# Patient Record
Sex: Male | Born: 1955
Health system: Southern US, Community
[De-identification: ages and names within clinical notes are randomized; demographics above are authoritative.]

## PROBLEM LIST (undated history)

## (undated) DIAGNOSIS — T7840XA Allergy, unspecified, initial encounter: Secondary | ICD-10-CM

## (undated) DIAGNOSIS — Z973 Presence of spectacles and contact lenses: Secondary | ICD-10-CM

## (undated) DIAGNOSIS — E785 Hyperlipidemia, unspecified: Secondary | ICD-10-CM

## (undated) DIAGNOSIS — B182 Chronic viral hepatitis C: Secondary | ICD-10-CM

## (undated) DIAGNOSIS — K219 Gastro-esophageal reflux disease without esophagitis: Secondary | ICD-10-CM

## (undated) DIAGNOSIS — M1990 Unspecified osteoarthritis, unspecified site: Secondary | ICD-10-CM

## (undated) DIAGNOSIS — G2581 Restless legs syndrome: Secondary | ICD-10-CM

## (undated) DIAGNOSIS — T148XXA Other injury of unspecified body region, initial encounter: Secondary | ICD-10-CM

## (undated) DIAGNOSIS — N529 Male erectile dysfunction, unspecified: Secondary | ICD-10-CM

## (undated) DIAGNOSIS — I2510 Atherosclerotic heart disease of native coronary artery without angina pectoris: Secondary | ICD-10-CM

## (undated) HISTORY — DX: Chronic viral hepatitis C: B18.2

## (undated) HISTORY — DX: Hyperlipidemia, unspecified: E78.5

## (undated) HISTORY — PX: GUM SURGERY: SHX658

## (undated) HISTORY — PX: FRACTURE SURGERY: SHX138

## (undated) HISTORY — DX: Unspecified osteoarthritis, unspecified site: M19.90

## (undated) HISTORY — DX: Allergy, unspecified, initial encounter: T78.40XA

## (undated) HISTORY — PX: WISDOM TOOTH EXTRACTION: SHX21

## (undated) HISTORY — DX: Atherosclerotic heart disease of native coronary artery without angina pectoris: I25.10

## (undated) HISTORY — PX: COLONOSCOPY: SHX174

## (undated) HISTORY — DX: Gastro-esophageal reflux disease without esophagitis: K21.9

## (undated) HISTORY — DX: Male erectile dysfunction, unspecified: N52.9

---

## 1979-03-04 HISTORY — DX: Other motorcycle driver injured in collision with unspecified motor vehicles in traffic accident, initial encounter: V29.408A

## 2002-03-03 HISTORY — PX: APPENDECTOMY: SHX54

## 2004-05-20 ENCOUNTER — Ambulatory Visit: Payer: Self-pay | Admitting: Internal Medicine

## 2004-05-28 ENCOUNTER — Ambulatory Visit: Payer: Self-pay | Admitting: Internal Medicine

## 2004-06-17 ENCOUNTER — Ambulatory Visit: Payer: Self-pay | Admitting: Internal Medicine

## 2004-07-01 ENCOUNTER — Ambulatory Visit: Payer: Self-pay | Admitting: Internal Medicine

## 2004-07-02 ENCOUNTER — Encounter: Payer: Self-pay | Admitting: Internal Medicine

## 2004-09-12 ENCOUNTER — Ambulatory Visit: Payer: Self-pay | Admitting: Internal Medicine

## 2004-09-12 ENCOUNTER — Encounter: Payer: Self-pay | Admitting: Internal Medicine

## 2004-10-21 ENCOUNTER — Ambulatory Visit (HOSPITAL_COMMUNITY): Admission: RE | Admit: 2004-10-21 | Discharge: 2004-10-21 | Payer: Self-pay | Admitting: Internal Medicine

## 2004-10-21 ENCOUNTER — Encounter (INDEPENDENT_AMBULATORY_CARE_PROVIDER_SITE_OTHER): Payer: Self-pay | Admitting: Specialist

## 2005-04-14 ENCOUNTER — Ambulatory Visit: Payer: Self-pay | Admitting: Internal Medicine

## 2005-06-13 ENCOUNTER — Ambulatory Visit: Payer: Self-pay | Admitting: Internal Medicine

## 2005-09-25 ENCOUNTER — Ambulatory Visit: Payer: Self-pay | Admitting: Gastroenterology

## 2005-10-06 ENCOUNTER — Ambulatory Visit: Payer: Self-pay | Admitting: Gastroenterology

## 2006-01-20 ENCOUNTER — Ambulatory Visit: Payer: Self-pay | Admitting: Internal Medicine

## 2007-01-22 ENCOUNTER — Ambulatory Visit: Payer: Self-pay | Admitting: Family Medicine

## 2007-01-22 DIAGNOSIS — B351 Tinea unguium: Secondary | ICD-10-CM | POA: Insufficient documentation

## 2007-01-22 DIAGNOSIS — Z8619 Personal history of other infectious and parasitic diseases: Secondary | ICD-10-CM

## 2007-09-08 ENCOUNTER — Ambulatory Visit: Payer: Self-pay | Admitting: Internal Medicine

## 2007-09-08 DIAGNOSIS — R5381 Other malaise: Secondary | ICD-10-CM | POA: Insufficient documentation

## 2007-09-08 DIAGNOSIS — R5383 Other fatigue: Secondary | ICD-10-CM

## 2007-09-08 DIAGNOSIS — K219 Gastro-esophageal reflux disease without esophagitis: Secondary | ICD-10-CM | POA: Insufficient documentation

## 2007-09-08 DIAGNOSIS — E785 Hyperlipidemia, unspecified: Secondary | ICD-10-CM | POA: Insufficient documentation

## 2007-09-09 ENCOUNTER — Telehealth: Payer: Self-pay | Admitting: Internal Medicine

## 2007-09-09 LAB — CONVERTED CEMR LAB
ALT: 86 units/L — ABNORMAL HIGH (ref 0–53)
Albumin: 4 g/dL (ref 3.5–5.2)
Basophils Absolute: 0 10*3/uL (ref 0.0–0.1)
Basophils Relative: 0.5 % (ref 0.0–1.0)
Bilirubin, Direct: 0.1 mg/dL (ref 0.0–0.3)
CO2: 31 meq/L (ref 19–32)
Chloride: 107 meq/L (ref 96–112)
Eosinophils Relative: 1.9 % (ref 0.0–5.0)
Glucose, Bld: 72 mg/dL (ref 70–99)
HCT: 39.8 % (ref 39.0–52.0)
Hemoglobin: 13.9 g/dL (ref 13.0–17.0)
MCV: 90.8 fL (ref 78.0–100.0)
Neutro Abs: 1.5 10*3/uL (ref 1.4–7.7)
Platelets: 150 10*3/uL (ref 150–400)
Potassium: 5.3 meq/L — ABNORMAL HIGH (ref 3.5–5.1)
RBC: 4.38 M/uL (ref 4.22–5.81)
RDW: 12.8 % (ref 11.5–14.6)
Sodium: 142 meq/L (ref 135–145)
TSH: 2.18 microintl units/mL (ref 0.35–5.50)
Total Bilirubin: 0.9 mg/dL (ref 0.3–1.2)
Total Protein: 7.2 g/dL (ref 6.0–8.3)
WBC: 3.2 10*3/uL — ABNORMAL LOW (ref 4.5–10.5)

## 2007-10-19 ENCOUNTER — Telehealth: Payer: Self-pay | Admitting: Internal Medicine

## 2008-10-17 ENCOUNTER — Telehealth: Payer: Self-pay | Admitting: Internal Medicine

## 2008-12-04 ENCOUNTER — Ambulatory Visit: Payer: Self-pay | Admitting: Internal Medicine

## 2008-12-04 DIAGNOSIS — N529 Male erectile dysfunction, unspecified: Secondary | ICD-10-CM

## 2008-12-07 LAB — CONVERTED CEMR LAB
Alkaline Phosphatase: 56 units/L (ref 39–117)
Basophils Relative: 1.7 % (ref 0.0–3.0)
Bilirubin, Direct: 0.1 mg/dL (ref 0.0–0.3)
Calcium: 9.8 mg/dL (ref 8.4–10.5)
Eosinophils Absolute: 0.1 10*3/uL (ref 0.0–0.7)
Glucose, Bld: 90 mg/dL (ref 70–99)
Hemoglobin: 14.3 g/dL (ref 13.0–17.0)
Lymphs Abs: 1.7 10*3/uL (ref 0.7–4.0)
MCV: 95.6 fL (ref 78.0–100.0)
Monocytes Absolute: 0.6 10*3/uL (ref 0.1–1.0)
Neutro Abs: 1.7 10*3/uL (ref 1.4–7.7)
Neutrophils Relative %: 43.1 % (ref 43.0–77.0)
RBC: 4.45 M/uL (ref 4.22–5.81)
RDW: 12.1 % (ref 11.5–14.6)
Sodium: 141 meq/L (ref 135–145)
Total Bilirubin: 0.6 mg/dL (ref 0.3–1.2)
Total Protein: 7.3 g/dL (ref 6.0–8.3)

## 2009-01-08 ENCOUNTER — Ambulatory Visit: Payer: Self-pay | Admitting: Internal Medicine

## 2009-01-08 LAB — CONVERTED CEMR LAB
ALT: 152 units/L — ABNORMAL HIGH (ref 0–53)
Alkaline Phosphatase: 59 units/L (ref 39–117)
Bilirubin, Direct: 0 mg/dL (ref 0.0–0.3)
INR: 1 (ref 0.8–1.0)
Prothrombin Time: 10.2 s (ref 9.1–11.7)
Total Bilirubin: 0.7 mg/dL (ref 0.3–1.2)
Total Protein: 7.7 g/dL (ref 6.0–8.3)

## 2009-05-01 HISTORY — PX: OTHER SURGICAL HISTORY: SHX169

## 2009-06-14 ENCOUNTER — Ambulatory Visit: Payer: Self-pay | Admitting: Internal Medicine

## 2009-06-14 DIAGNOSIS — L570 Actinic keratosis: Secondary | ICD-10-CM | POA: Insufficient documentation

## 2009-09-27 ENCOUNTER — Telehealth: Payer: Self-pay | Admitting: Internal Medicine

## 2010-04-02 NOTE — Progress Notes (Signed)
Summary: refill request for viagra  Phone Note Refill Request Call back at Work Phone [phone] Message from:  Patient  Refills Requested: Medication #1:  VIAGRA 100 MG  TABS 1/2 - 1 about a half hour before sex. Phoned request from pt, [facility].  Initial call taken by: Lowella Petties CMA,  September 27, 2009 12:26 PM  Follow-up for Phone Call        okay #10 x 3 Follow-up by: Cindee Salt MD,  September 27, 2009 1:20 PM  Additional Follow-up for Phone Call Additional follow up Details #1::        Rx called to pharmacy Additional Follow-up by: Janee Morn CMA,  September 27, 2009 1:50 PM    Prescriptions: VIAGRA 100 MG  TABS (SILDENAFIL CITRATE) 1/2 - 1 about a half hour before sex  #10 x 3   Entered by:   Janee Morn CMA   Authorized by:   Cindee Salt MD   Signed by:   Janee Morn CMA on 09/27/2009   Method used:   Telephoned to ...         RxID:   [id number]
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
## 2010-04-17 ENCOUNTER — Other Ambulatory Visit: Payer: Self-pay | Admitting: Internal Medicine

## 2010-04-17 ENCOUNTER — Encounter: Payer: Self-pay | Admitting: Internal Medicine

## 2010-04-17 ENCOUNTER — Encounter (INDEPENDENT_AMBULATORY_CARE_PROVIDER_SITE_OTHER): Payer: BC Managed Care – PPO | Admitting: Internal Medicine

## 2010-04-17 DIAGNOSIS — Z125 Encounter for screening for malignant neoplasm of prostate: Secondary | ICD-10-CM

## 2010-04-17 DIAGNOSIS — B182 Chronic viral hepatitis C: Secondary | ICD-10-CM

## 2010-04-17 DIAGNOSIS — K219 Gastro-esophageal reflux disease without esophagitis: Secondary | ICD-10-CM

## 2010-04-17 DIAGNOSIS — Z0000 Encounter for general adult medical examination without abnormal findings: Secondary | ICD-10-CM

## 2010-04-17 LAB — PROTIME-INR: INR: 1 ratio (ref 0.8–1.0)

## 2010-04-17 LAB — HEPATIC FUNCTION PANEL
AST: 71 U/L — ABNORMAL HIGH (ref 0–37)
Alkaline Phosphatase: 54 U/L (ref 39–117)
Total Bilirubin: 0.5 mg/dL (ref 0.3–1.2)
Total Protein: 7.5 g/dL (ref 6.0–8.3)

## 2010-04-17 LAB — RENAL FUNCTION PANEL
CO2: 32 mEq/L (ref 19–32)
Calcium: 9.7 mg/dL (ref 8.4–10.5)
Creatinine, Ser: 1 mg/dL (ref 0.4–1.5)
Glucose, Bld: 82 mg/dL (ref 70–99)
Potassium: 5.4 mEq/L — ABNORMAL HIGH (ref 3.5–5.1)

## 2010-04-17 LAB — TSH: TSH: 1.75 u[IU]/mL (ref 0.35–5.50)

## 2010-04-18 ENCOUNTER — Encounter: Payer: Self-pay | Admitting: Internal Medicine

## 2010-04-18 LAB — CBC WITH DIFFERENTIAL/PLATELET
Basophils Relative: 0.3 % (ref 0.0–3.0)
Eosinophils Absolute: 0.1 10*3/uL (ref 0.0–0.7)
Eosinophils Relative: 1.7 % (ref 0.0–5.0)
Hemoglobin: 15.4 g/dL (ref 13.0–17.0)
MCHC: 34.1 g/dL (ref 30.0–36.0)
Monocytes Absolute: 0.5 10*3/uL (ref 0.1–1.0)
Monocytes Relative: 13.1 % — ABNORMAL HIGH (ref 3.0–12.0)
Neutro Abs: 1.8 10*3/uL (ref 1.4–7.7)
Platelets: 192 10*3/uL (ref 150.0–400.0)
WBC: 3.5 10*3/uL — ABNORMAL LOW (ref 4.5–10.5)

## 2010-04-19 LAB — CONVERTED CEMR LAB: HCV Quantitative: 186000 intl units/mL — ABNORMAL HIGH (ref <43)

## 2010-04-22 ENCOUNTER — Other Ambulatory Visit: Payer: Self-pay | Admitting: Internal Medicine

## 2010-04-22 DIAGNOSIS — R748 Abnormal levels of other serum enzymes: Secondary | ICD-10-CM
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
## 2010-05-10 ENCOUNTER — Other Ambulatory Visit: Payer: Self-pay | Admitting: Interventional Radiology

## 2010-05-10 ENCOUNTER — Encounter: Payer: Self-pay | Admitting: Internal Medicine

## 2010-05-10 ENCOUNTER — Ambulatory Visit (HOSPITAL_COMMUNITY)
Admission: RE | Admit: 2010-05-10 | Discharge: 2010-05-10 | Disposition: A | Discharge status: Home / Self Care | Payer: BC Managed Care – PPO | Source: Ambulatory Visit | Attending: Internal Medicine | Admitting: Internal Medicine

## 2010-05-10 ENCOUNTER — Ambulatory Visit (HOSPITAL_COMMUNITY): Payer: BC Managed Care – PPO

## 2010-05-10 DIAGNOSIS — R748 Abnormal levels of other serum enzymes: Secondary | ICD-10-CM

## 2010-05-10 DIAGNOSIS — B182 Chronic viral hepatitis C: Secondary | ICD-10-CM | POA: Insufficient documentation

## 2010-05-10 DIAGNOSIS — R7989 Other specified abnormal findings of blood chemistry: Secondary | ICD-10-CM | POA: Insufficient documentation

## 2010-05-10 LAB — CBC
HCT: 41.9 % (ref 39.0–52.0)
MCH: 31 pg (ref 26.0–34.0)
MCHC: 34.1 g/dL (ref 30.0–36.0)
RDW: 12.7 % (ref 11.5–15.5)
WBC: 3 10*3/uL — ABNORMAL LOW (ref 4.0–10.5)

## 2010-05-10 LAB — PROTIME-INR: INR: 1.01 (ref 0.00–1.49)

## 2010-05-15 ENCOUNTER — Telehealth: Payer: Self-pay | Admitting: Internal Medicine

## 2010-05-21 NOTE — Progress Notes (Signed)
  Phone Note Outgoing Call   Call placed by: Cindee Salt MD,  May 15, 2010 1:50 PM Summary of Call: I got the biopsy report Reviewed it with Dr Luisa Hart by phone and he confirmed no fibrosis on the biopsy results  discussed with patient despite ongoing active hepatitis, no fibrosis indicates that Rx is probably not a good idea at this time Initial call taken by: Cindee Salt MD,  May 15, 2010 1:51 PM

## 2010-06-12 ENCOUNTER — Encounter: Payer: Self-pay | Admitting: Internal Medicine

## 2010-06-17 ENCOUNTER — Ambulatory Visit (INDEPENDENT_AMBULATORY_CARE_PROVIDER_SITE_OTHER): Payer: BC Managed Care – PPO | Admitting: Family Medicine

## 2010-06-17 ENCOUNTER — Encounter: Payer: Self-pay | Admitting: Family Medicine

## 2010-06-17 VITALS — BP 112/72 | HR 84 | Temp 98.4°F | Ht 68.0 in | Wt 156.0 lb

## 2010-06-17 DIAGNOSIS — L570 Actinic keratosis: Secondary | ICD-10-CM

## 2010-06-17 NOTE — Progress Notes (Signed)
H/o AK prev treated with liq N2, responded to treatment.  Now with some other enlarging spots.  D/w pt about sunscreen. He's outside frequently.  Feeling well o/w.    ROS:  See HPI.  Otherwise negative.  nad ncat Scalp wnl except for 3 rough feeling areas on L side of forehead c/w AK. No acute skin changes other than mild sunburn on arms

## 2010-06-17 NOTE — Patient Instructions (Signed)
Keep your forehead clean.   The areas should blister and then heal.  If you have significant pain or redness, then notify us.  If you feel other rough spots, let us know.  Take care.

## 2010-06-17 NOTE — Assessment & Plan Note (Signed)
Verbal consent obtained before procedure and typical course of healing d/w pt.  He agrees to proceed.  Liq N2- 3 freeze/thaw cycles at each lesion.  Tolerated well.  Fu prn.

## 2011-07-18 ENCOUNTER — Encounter: Payer: BC Managed Care – PPO | Admitting: Internal Medicine

## 2011-11-27 ENCOUNTER — Other Ambulatory Visit: Payer: Self-pay | Admitting: Orthopedic Surgery

## 2011-11-27 DIAGNOSIS — M25511 Pain in right shoulder: Secondary | ICD-10-CM

## 2011-11-30 ENCOUNTER — Inpatient Hospital Stay: Admission: RE | Admit: 2011-11-30 | Payer: BC Managed Care – PPO | Source: Ambulatory Visit

## 2011-11-30 ENCOUNTER — Ambulatory Visit
Admission: RE | Admit: 2011-11-30 | Discharge: 2011-11-30 | Disposition: A | Discharge status: Home / Self Care | Payer: BC Managed Care – PPO | Source: Ambulatory Visit | Attending: Orthopedic Surgery | Admitting: Orthopedic Surgery

## 2011-11-30 DIAGNOSIS — M25511 Pain in right shoulder: Secondary | ICD-10-CM

## 2011-12-15 ENCOUNTER — Encounter: Payer: BC Managed Care – PPO | Admitting: Internal Medicine

## 2011-12-25 ENCOUNTER — Encounter: Payer: Self-pay | Admitting: Internal Medicine

## 2011-12-25 ENCOUNTER — Ambulatory Visit (INDEPENDENT_AMBULATORY_CARE_PROVIDER_SITE_OTHER): Payer: BC Managed Care – PPO | Admitting: Internal Medicine

## 2011-12-25 VITALS — BP 128/72 | HR 76 | Temp 98.0°F | Ht 69.0 in | Wt 162.2 lb

## 2011-12-25 DIAGNOSIS — K219 Gastro-esophageal reflux disease without esophagitis: Secondary | ICD-10-CM

## 2011-12-25 DIAGNOSIS — B182 Chronic viral hepatitis C: Secondary | ICD-10-CM

## 2011-12-25 DIAGNOSIS — L570 Actinic keratosis: Secondary | ICD-10-CM

## 2011-12-25 DIAGNOSIS — Z0000 Encounter for general adult medical examination without abnormal findings: Secondary | ICD-10-CM

## 2011-12-25 LAB — CBC WITH DIFFERENTIAL/PLATELET
Eosinophils Absolute: 0 10*3/uL (ref 0.0–0.7)
HCT: 44.5 % (ref 39.0–52.0)
Lymphs Abs: 1.4 10*3/uL (ref 0.7–4.0)
MCHC: 33.3 g/dL (ref 30.0–36.0)
MCV: 93.7 fl (ref 78.0–100.0)
Neutrophils Relative %: 59.1 % (ref 43.0–77.0)
Platelets: 163 10*3/uL (ref 150.0–400.0)
RBC: 4.75 Mil/uL (ref 4.22–5.81)
RDW: 13.1 % (ref 11.5–14.6)
WBC: 5.2 10*3/uL (ref 4.5–10.5)

## 2011-12-25 MED ORDER — SILDENAFIL CITRATE 100 MG PO TABS: 50.0000 mg | ORAL_TABLET | Freq: Every day | ORAL | Status: DC | PRN

## 2011-12-25 NOTE — Assessment & Plan Note (Signed)
Chronic active but without fibrosis on last year's biopsy Will await safer Rx alternatives Check labs

## 2011-12-25 NOTE — Assessment & Plan Note (Signed)
Improved No regular meds
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
## 2011-12-25 NOTE — Assessment & Plan Note (Signed)
Healthy Discussed working more on fitness No PSA this year

## 2011-12-25 NOTE — Assessment & Plan Note (Signed)
Liquid nitrogen 45 seconds x 2 Wart and skin tag also treated

## 2011-12-26 LAB — BASIC METABOLIC PANEL
Creatinine, Ser: 1.1 mg/dL (ref 0.4–1.5)
GFR: 70.36 mL/min (ref >60.00)
Glucose, Bld: 82 mg/dL (ref 70–99)
Sodium: 140 mEq/L (ref 135–145)

## 2011-12-26 LAB — LIPID PANEL
Cholesterol: 154 mg/dL (ref 0–200)
HDL: 45 mg/dL (ref >39.00)
LDL Cholesterol: 94 mg/dL (ref 0–99)

## 2011-12-26 LAB — HEPATIC FUNCTION PANEL
Albumin: 3.9 g/dL (ref 3.5–5.2)
Bilirubin, Direct: 0.1 mg/dL (ref 0.0–0.3)
Total Bilirubin: 0.8 mg/dL (ref 0.3–1.2)

## 2011-12-29 ENCOUNTER — Encounter: Payer: Self-pay | Admitting: *Deleted

## 2012-03-03 HISTORY — PX: ROTATOR CUFF REPAIR: SHX139

## 2012-06-17 ENCOUNTER — Telehealth: Payer: Self-pay

## 2012-06-17 NOTE — Telephone Encounter (Signed)
Pt notified to try to use the cortisone cream and f/u with [place] if no improvement, pt verbalized understanding

## 2012-06-17 NOTE — Telephone Encounter (Signed)
I advise using over the counter cortisone cream (like cort aid) that should work better and if not improved f/u

## 2012-06-17 NOTE — Telephone Encounter (Signed)
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 Pt request med [facility] to [facility].Please advise.

## 2012-06-17 NOTE — Telephone Encounter (Signed)
It looks like Dr. Milinda Antis already adressed this.

## 2012-06-21 ENCOUNTER — Encounter: Payer: Self-pay | Admitting: Internal Medicine

## 2012-06-21 ENCOUNTER — Ambulatory Visit (INDEPENDENT_AMBULATORY_CARE_PROVIDER_SITE_OTHER): Payer: BC Managed Care – PPO | Admitting: Internal Medicine

## 2012-06-21 VITALS — BP 130/68 | HR 71 | Temp 97.4°F | Wt 163.5 lb

## 2012-06-21 DIAGNOSIS — L259 Unspecified contact dermatitis, unspecified cause: Secondary | ICD-10-CM | POA: Insufficient documentation

## 2012-06-21 MED ORDER — PREDNISONE 20 MG PO TABS: 40.0000 mg | ORAL_TABLET | Freq: Every day | ORAL | Status: DC

## 2012-06-21 NOTE — Patient Instructions (Signed)
The rash along your left flank could be early pityriasis rosea. You can try cetirizine 10mg  daily (or twice a day) for itching also  Pityriasis Rosea Pityriasis rosea is a rash which is probably caused by a virus. It generally starts as a scaly, red patch on the trunk (the area of the body that a t-shirt would cover) but does not appear on sun exposed areas. The rash is usually preceded by an initial larger spot called the "herald patch" a week or more before the rest of the rash appears. Generally within one to two days the rash appears rapidly on the trunk, upper arms, and sometimes the upper legs. The rash usually appears as flat, oval patches of scaly pink color. The rash can also be raised and one is able to feel it with a finger. The rash can also be finely crinkled and may slough off leaving a ring of scale around the spot. Sometimes a mild sore throat is present with the rash. It usually affects children and young adults in the spring and autumn. Women are more frequently affected than men. TREATMENT  Pityriasis rosea is a self-limited condition. This means it goes away within 4 to 8 weeks without treatment. The spots may persist for several months, especially in darker-colored skin after the rash has resolved and healed. Benadryl and steroid creams may be used if itching is a problem. SEEK MEDICAL CARE IF:   Your rash does not go away or persists longer than three months.  You develop fever and joint pain.  You develop severe headache and confusion.  You develop breathing difficulty, vomiting and/or extreme weakness. Document Released: 03/26/2001 Document Revised: 05/12/2011 Document Reviewed: 04/14/2008 [facility] Patient Information 2013 [place], [place]. Contact Dermatitis Contact dermatitis is a reaction to certain substances that touch the skin. Contact dermatitis can be either irritant contact dermatitis or allergic contact dermatitis. Irritant contact dermatitis does not require  previous exposure to the substance for a reaction to occur.Allergic contact dermatitis only occurs if you have been exposed to the substance before. Upon a repeat exposure, your body reacts to the substance.  CAUSES  Many substances can cause contact dermatitis. Irritant dermatitis is most commonly caused by repeated exposure to mildly irritating substances, such as:  Makeup.  Soaps.  Detergents.  Bleaches.  Acids.  Metal salts, such as nickel. Allergic contact dermatitis is most commonly caused by exposure to:  Poisonous plants.  Chemicals (deodorants, shampoos).  Jewelry.  Latex.  Neomycin in triple antibiotic cream.  Preservatives in products, including clothing. SYMPTOMS  The area of skin that is exposed may develop:  Dryness or flaking.  Redness.  Cracks.  Itching.  Pain or a burning sensation.  Blisters. With allergic contact dermatitis, there may also be swelling in areas such as the eyelids, mouth, or genitals.  DIAGNOSIS  Your caregiver can usually tell what the problem is by doing a physical exam. In cases where the cause is uncertain and an allergic contact dermatitis is suspected, a patch skin test may be performed to help determine the cause of your dermatitis. TREATMENT Treatment includes protecting the skin from further contact with the irritating substance by avoiding that substance if possible. Barrier creams, powders, and gloves may be helpful. Your caregiver may also recommend:  Steroid creams or ointments applied 2 times daily. For best results, soak the rash area in cool water for 20 minutes. Then apply the medicine. Cover the area with a plastic wrap. You can store the steroid cream in the  refrigerator for a "chilly" effect on your rash. That may decrease itching. Oral steroid medicines may be needed in more severe cases.  Antibiotics or antibacterial ointments if a skin infection is present.  Antihistamine lotion or an antihistamine taken by  mouth to ease itching.  Lubricants to keep moisture in your skin.  Burow's solution to reduce redness and soreness or to dry a weeping rash. Mix one packet or tablet of solution in 2 cups cool water. Dip a clean washcloth in the mixture, wring it out a bit, and put it on the affected area. Leave the cloth in place for 30 minutes. Do this as often as possible throughout the day.  Taking several cornstarch or baking soda baths daily if the area is too large to cover with a washcloth. Harsh chemicals, such as alkalis or acids, can cause skin damage that is like a burn. You should flush your skin for 15 to 20 minutes with cold water after such an exposure. You should also seek immediate medical care after exposure. Bandages (dressings), antibiotics, and pain medicine may be needed for severely irritated skin.  HOME CARE INSTRUCTIONS  Avoid the substance that caused your reaction.  Keep the area of skin that is affected away from hot water, soap, sunlight, chemicals, acidic substances, or anything else that would irritate your skin.  Do not scratch the rash. Scratching may cause the rash to become infected.  You may take cool baths to help stop the itching.  Only take over-the-counter or prescription medicines as directed by your caregiver.  See your caregiver for follow-up care as directed to make sure your skin is healing properly. SEEK MEDICAL CARE IF:   Your condition is not better after 3 days of treatment.  You seem to be getting worse.  You see signs of infection such as swelling, tenderness, redness, soreness, or warmth in the affected area.  You have any problems related to your medicines. Document Released: 02/15/2000 Document Revised: 05/12/2011 Document Reviewed: 07/23/2010 [facility] Patient Information 2013 [place], [place].
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
## 2012-06-21 NOTE — Assessment & Plan Note (Signed)
Seems to have contact derm but rash along left flank could be start of pityriasis rosea  Will treat with prednisone Cetirizine prn Discussed PR

## 2013-01-06 ENCOUNTER — Other Ambulatory Visit: Payer: Self-pay

## 2013-01-21 ENCOUNTER — Other Ambulatory Visit: Payer: Self-pay | Admitting: Internal Medicine

## 2013-08-16 ENCOUNTER — Telehealth: Payer: Self-pay | Admitting: Internal Medicine

## 2013-08-16 NOTE — Telephone Encounter (Signed)
Pt called to schedule cpe. His last cpe was 12/2011. Your next available cpe slot is not until 01/2014. Would you be able to accommodate him sometime before October?

## 2013-08-16 NOTE — Telephone Encounter (Signed)
Daryl Cook, look at 9/23, 9/29, 9/30, you can put him in a new patient slot

## 2013-08-16 NOTE — Telephone Encounter (Signed)
Thanks! Pt is scheduled 9/23.

## 2013-11-23 ENCOUNTER — Encounter: Payer: Self-pay | Admitting: Internal Medicine

## 2013-11-23 ENCOUNTER — Ambulatory Visit (INDEPENDENT_AMBULATORY_CARE_PROVIDER_SITE_OTHER): Payer: BC Managed Care – PPO | Admitting: Internal Medicine

## 2013-11-23 VITALS — BP 124/72 | HR 75 | Temp 98.0°F | Ht 67.0 in | Wt 163.0 lb

## 2013-11-23 DIAGNOSIS — Z23 Encounter for immunization: Secondary | ICD-10-CM

## 2013-11-23 DIAGNOSIS — E785 Hyperlipidemia, unspecified: Secondary | ICD-10-CM

## 2013-11-23 DIAGNOSIS — L219 Seborrheic dermatitis, unspecified: Secondary | ICD-10-CM | POA: Insufficient documentation

## 2013-11-23 DIAGNOSIS — Z0000 Encounter for general adult medical examination without abnormal findings: Secondary | ICD-10-CM

## 2013-11-23 DIAGNOSIS — B182 Chronic viral hepatitis C: Secondary | ICD-10-CM

## 2013-11-23 DIAGNOSIS — Z125 Encounter for screening for malignant neoplasm of prostate: Secondary | ICD-10-CM

## 2013-11-23 DIAGNOSIS — K219 Gastro-esophageal reflux disease without esophagitis: Secondary | ICD-10-CM

## 2013-11-23 LAB — CBC WITH DIFFERENTIAL/PLATELET
Basophils Absolute: 0 10*3/uL (ref 0.0–0.1)
Basophils Relative: 0.7 % (ref 0.0–3.0)
Eosinophils Absolute: 0.1 10*3/uL (ref 0.0–0.7)
Eosinophils Relative: 1.1 % (ref 0.0–5.0)
HEMATOCRIT: 44.7 % (ref 39.0–52.0)
Hemoglobin: 15 g/dL (ref 13.0–17.0)
LYMPHS PCT: 34.5 % (ref 12.0–46.0)
Lymphs Abs: 1.6 10*3/uL (ref 0.7–4.0)
MCHC: 33.5 g/dL (ref 30.0–36.0)
MCV: 92.5 fl (ref 78.0–100.0)
MONO ABS: 0.6 10*3/uL (ref 0.1–1.0)
MONOS PCT: 14 % — AB (ref 3.0–12.0)
Neutro Abs: 2.3 10*3/uL (ref 1.4–7.7)
Neutrophils Relative %: 49.7 % (ref 43.0–77.0)
Platelets: 207 10*3/uL (ref 150.0–400.0)
RBC: 4.83 Mil/uL (ref 4.22–5.81)
RDW: 12.9 % (ref 11.5–15.5)
WBC: 4.6 10*3/uL (ref 4.0–10.5)

## 2013-11-23 LAB — COMPREHENSIVE METABOLIC PANEL
ALBUMIN: 4.2 g/dL (ref 3.5–5.2)
ALK PHOS: 54 U/L (ref 39–117)
ALT: 57 U/L — ABNORMAL HIGH (ref 0–53)
AST: 42 U/L — AB (ref 0–37)
BUN: 13 mg/dL (ref 6–23)
CALCIUM: 10.1 mg/dL (ref 8.4–10.5)
CO2: 32 meq/L (ref 19–32)
CREATININE: 1 mg/dL (ref 0.4–1.5)
Chloride: 101 mEq/L (ref 96–112)
GFR: 81.3 mL/min (ref >60.00)
GLUCOSE: 87 mg/dL (ref 70–99)
POTASSIUM: 5 meq/L (ref 3.5–5.1)
Sodium: 138 mEq/L (ref 135–145)
Total Bilirubin: 0.3 mg/dL (ref 0.2–1.2)
Total Protein: 8.2 g/dL (ref 6.0–8.3)

## 2013-11-23 LAB — T4, FREE: Free T4: 0.75 ng/dL (ref 0.60–1.60)

## 2013-11-23 LAB — PSA: PSA: 1.38 ng/mL (ref 0.10–4.00)

## 2013-11-23 MED ORDER — SILDENAFIL CITRATE 20 MG PO TABS: 60.0000 mg | ORAL_TABLET | Freq: Every day | ORAL | Status: DC | PRN

## 2013-11-23 MED ORDER — KETOCONAZOLE 2 % EX SHAM: 1.0000 "application " | MEDICATED_SHAMPOO | CUTANEOUS | Status: DC

## 2013-11-23 MED ORDER — HYDROCORTISONE 2.5 % EX CREA: TOPICAL_CREAM | Freq: Two times a day (BID) | CUTANEOUS | Status: DC | PRN

## 2013-11-23 NOTE — Assessment & Plan Note (Signed)
Hep A/B vaccine Check fibrosure-- consider Rx if fibrosis

## 2013-11-23 NOTE — Assessment & Plan Note (Signed)
Does okay on the PPI

## 2013-11-23 NOTE — Assessment & Plan Note (Signed)
Will try shampoo and HC cream

## 2013-11-23 NOTE — Progress Notes (Signed)
Pre visit review using our clinic review tool, if applicable. No additional management support is needed unless otherwise documented below in the visit note. 

## 2013-11-23 NOTE — Assessment & Plan Note (Signed)
Will check PSA after discussion Flu shot Tdap
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
## 2013-11-23 NOTE — Addendum Note (Signed)
Addended by: Despina Hidden on: 11/23/2013 01:08 PM   Modules accepted: Orders

## 2013-11-23 NOTE — Assessment & Plan Note (Signed)
No Rx with hep c

## 2013-11-23 NOTE — Addendum Note (Signed)
Addended by: Ellamae Sia on: 11/23/2013 12:49 PM   Modules accepted: Orders

## 2013-11-29 LAB — HEPATITIS C VIRUS FIBROSURE
ALT (SGPT)(02): 67 IU/L — AB (ref ?–55)
Alpha-2-Macroglobulin, Qn: 353 mg/dL — ABNORMAL HIGH (ref 110–276)
Apolipoprotein A-1: 146 mg/dL (ref 110–180)
Bilirubin, total: 0.2 mg/dL (ref 0.0–1.2)
FIBROSIS SCORE(01): 0.38 — AB (ref 0.00–0.21)
GGT(02): 35 IU/L (ref 0–65)
HAPTOGLOBIN(02): 125 mg/dL (ref 34–200)
Necroinflammatory Activity Score: 0.43 — ABNORMAL HIGH (ref 0.00–0.17)

## 2013-12-22 ENCOUNTER — Telehealth: Payer: Self-pay

## 2013-12-22 NOTE — Telephone Encounter (Signed)
Pt request 11/23/13 mailed to pts verified home address; advised pt done.

## 2014-03-01 ENCOUNTER — Ambulatory Visit (INDEPENDENT_AMBULATORY_CARE_PROVIDER_SITE_OTHER): Payer: BC Managed Care – PPO

## 2014-03-01 DIAGNOSIS — Z23 Encounter for immunization: Secondary | ICD-10-CM

## 2014-09-26 ENCOUNTER — Other Ambulatory Visit: Payer: Self-pay | Admitting: Orthopedic Surgery

## 2014-09-26 DIAGNOSIS — S82201P Unspecified fracture of shaft of right tibia, subsequent encounter for closed fracture with malunion: Secondary | ICD-10-CM

## 2014-09-27 ENCOUNTER — Other Ambulatory Visit: Payer: Self-pay | Admitting: Orthopedic Surgery

## 2014-09-27 DIAGNOSIS — S82201P Unspecified fracture of shaft of right tibia, subsequent encounter for closed fracture with malunion: Secondary | ICD-10-CM

## 2014-10-23 ENCOUNTER — Telehealth: Payer: Self-pay | Admitting: Internal Medicine

## 2014-10-23 ENCOUNTER — Other Ambulatory Visit: Payer: Self-pay

## 2014-10-23 ENCOUNTER — Inpatient Hospital Stay: Admission: RE | Admit: 2014-10-23 | Payer: Self-pay | Source: Ambulatory Visit

## 2014-10-23 ENCOUNTER — Ambulatory Visit
Admission: RE | Admit: 2014-10-23 | Discharge: 2014-10-23 | Disposition: A | Discharge status: Home / Self Care | Payer: BLUE CROSS/BLUE SHIELD | Source: Ambulatory Visit | Attending: Orthopedic Surgery | Admitting: Orthopedic Surgery

## 2014-10-23 DIAGNOSIS — S82201P Unspecified fracture of shaft of right tibia, subsequent encounter for closed fracture with malunion: Secondary | ICD-10-CM

## 2014-10-23 NOTE — Telephone Encounter (Signed)
Pt called wanting to know if he needed any more hep a & hep b shots He had one 12/30

## 2014-10-24 ENCOUNTER — Ambulatory Visit (INDEPENDENT_AMBULATORY_CARE_PROVIDER_SITE_OTHER): Payer: BLUE CROSS/BLUE SHIELD

## 2014-10-24 DIAGNOSIS — Z23 Encounter for immunization: Secondary | ICD-10-CM

## 2014-10-24 NOTE — Telephone Encounter (Signed)
Spoke with patient and advised results, he just needs nurse visit appt

## 2014-10-25 ENCOUNTER — Ambulatory Visit: Payer: BLUE CROSS/BLUE SHIELD

## 2014-12-11 ENCOUNTER — Encounter: Payer: Self-pay | Admitting: Internal Medicine

## 2014-12-11 ENCOUNTER — Ambulatory Visit (INDEPENDENT_AMBULATORY_CARE_PROVIDER_SITE_OTHER): Payer: BLUE CROSS/BLUE SHIELD | Admitting: Internal Medicine

## 2014-12-11 VITALS — BP 128/60 | HR 74 | Temp 97.7°F | Wt 159.0 lb

## 2014-12-11 DIAGNOSIS — J029 Acute pharyngitis, unspecified: Secondary | ICD-10-CM | POA: Diagnosis not present

## 2014-12-11 LAB — POCT RAPID STREP A (OFFICE): Rapid Strep A Screen: NEGATIVE
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
## 2014-12-11 NOTE — Progress Notes (Signed)
Pre visit review using our clinic review tool, if applicable. No additional management support is needed unless otherwise documented below in the visit note. 

## 2014-12-11 NOTE — Assessment & Plan Note (Addendum)
Intermittent for 2 weeks Worse systemic symptoms yesterday Low risk strep and rapid test negative ?allergy component Doesn't seem to be reflux related Will have him try the ibuprofen and add antihistamine ENT eval if persists

## 2014-12-11 NOTE — Patient Instructions (Signed)
Please try an over the counter antihistamine like fexofenadine 180mg  daily or cetirzine 10mg  daily. If you are no better in 1-2 weeks, set up with [facility] ENT

## 2015-05-02 ENCOUNTER — Encounter: Payer: Self-pay | Admitting: Internal Medicine

## 2015-05-02 ENCOUNTER — Ambulatory Visit (INDEPENDENT_AMBULATORY_CARE_PROVIDER_SITE_OTHER): Payer: BLUE CROSS/BLUE SHIELD | Admitting: Internal Medicine

## 2015-05-02 VITALS — BP 120/80 | HR 76 | Temp 97.8°F | Ht 67.0 in | Wt 157.0 lb

## 2015-05-02 DIAGNOSIS — B182 Chronic viral hepatitis C: Secondary | ICD-10-CM | POA: Diagnosis not present

## 2015-05-02 DIAGNOSIS — Z0000 Encounter for general adult medical examination without abnormal findings: Secondary | ICD-10-CM | POA: Diagnosis not present

## 2015-05-02 DIAGNOSIS — L219 Seborrheic dermatitis, unspecified: Secondary | ICD-10-CM

## 2015-05-02 DIAGNOSIS — K219 Gastro-esophageal reflux disease without esophagitis: Secondary | ICD-10-CM

## 2015-05-02 LAB — COMPREHENSIVE METABOLIC PANEL
ALBUMIN: 4.3 g/dL (ref 3.5–5.2)
ALT: 261 U/L — ABNORMAL HIGH (ref 0–53)
AST: 165 U/L — ABNORMAL HIGH (ref 0–37)
Alkaline Phosphatase: 68 U/L (ref 39–117)
BUN: 13 mg/dL (ref 6–23)
CO2: 31 mEq/L (ref 19–32)
Calcium: 10.3 mg/dL (ref 8.4–10.5)
Chloride: 103 mEq/L (ref 96–112)
Creatinine, Ser: 1.02 mg/dL (ref 0.40–1.50)
GFR: 79.08 mL/min (ref >60.00)
Glucose, Bld: 98 mg/dL (ref 70–99)
POTASSIUM: 5 meq/L (ref 3.5–5.1)
Sodium: 140 mEq/L (ref 135–145)
TOTAL PROTEIN: 8.2 g/dL (ref 6.0–8.3)
Total Bilirubin: 0.3 mg/dL (ref 0.2–1.2)

## 2015-05-02 LAB — CBC WITH DIFFERENTIAL/PLATELET
BASOS PCT: 0.6 % (ref 0.0–3.0)
Basophils Absolute: 0 10*3/uL (ref 0.0–0.1)
EOS ABS: 0.1 10*3/uL (ref 0.0–0.7)
EOS PCT: 2.9 % (ref 0.0–5.0)
HEMATOCRIT: 45 % (ref 39.0–52.0)
Hemoglobin: 15.1 g/dL (ref 13.0–17.0)
LYMPHS PCT: 34.3 % (ref 12.0–46.0)
Lymphs Abs: 1.2 10*3/uL (ref 0.7–4.0)
MCHC: 33.5 g/dL (ref 30.0–36.0)
MCV: 89.9 fl (ref 78.0–100.0)
Monocytes Absolute: 0.6 10*3/uL (ref 0.1–1.0)
Monocytes Relative: 16 % — ABNORMAL HIGH (ref 3.0–12.0)
NEUTROS ABS: 1.7 10*3/uL (ref 1.4–7.7)
NEUTROS PCT: 46.2 % (ref 43.0–77.0)
PLATELETS: 209 10*3/uL (ref 150.0–400.0)
RBC: 5.01 Mil/uL (ref 4.22–5.81)
RDW: 13.6 % (ref 11.5–15.5)
WBC: 3.6 10*3/uL — ABNORMAL LOW (ref 4.0–10.5)

## 2015-05-02 MED ORDER — TRIAMCINOLONE ACETONIDE 0.1 % EX CREA: 1.0000 "application " | TOPICAL_CREAM | Freq: Two times a day (BID) | CUTANEOUS | Status: DC | PRN

## 2015-05-02 MED ORDER — KETOCONAZOLE 2 % EX SHAM: 1.0000 "application " | MEDICATED_SHAMPOO | CUTANEOUS | Status: DC

## 2015-05-02 MED ORDER — SILDENAFIL CITRATE 20 MG PO TABS: 60.0000 mg | ORAL_TABLET | Freq: Every day | ORAL | Status: DC | PRN

## 2015-05-02 NOTE — Assessment & Plan Note (Signed)
Due for colonoscopy later this year Will defer PSA after discussion He is going to work on fitness

## 2015-05-02 NOTE — Assessment & Plan Note (Signed)
Will check fibrosure again and proceed with referral if any worse

## 2015-05-02 NOTE — Assessment & Plan Note (Signed)
Okay with PPI 

## 2015-05-02 NOTE — Addendum Note (Signed)
Addended by: Daralene Milch C on: 05/02/2015 10:22 AM   Modules accepted: Miquel Dunn

## 2015-05-02 NOTE — Assessment & Plan Note (Signed)
Will try stronger cortisone cream

## 2015-05-02 NOTE — Progress Notes (Signed)
Pre visit review using our clinic review tool, if applicable. No additional management support is needed unless otherwise documented below in the visit note. 
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
## 2015-05-04 ENCOUNTER — Other Ambulatory Visit: Payer: Self-pay | Admitting: Internal Medicine

## 2015-05-04 DIAGNOSIS — B182 Chronic viral hepatitis C: Secondary | ICD-10-CM

## 2015-05-04 LAB — HCV FIBROSURE
ALPHA 2-MACROGLOBULINS, QN: 396 mg/dL — ABNORMAL HIGH (ref 110–276)
ALT (SGPT) P5P: 357 IU/L — AB (ref 0–55)
APOLIPOPROTEIN A I: 143 mg/dL (ref 101–178)
BILIRUBIN, TOTAL: 0.1 mg/dL (ref 0.0–1.2)
Fibrosis Score: 0.53 — ABNORMAL HIGH (ref 0.00–0.21)
GGT: 148 IU/L — AB (ref 0–65)
HAPTOGLOBIN: 88 mg/dL (ref 34–200)
NECROINFLAMMAT ACTIVITY SCORE: 0.94 — AB (ref 0.00–0.17)

## 2015-06-06 ENCOUNTER — Telehealth: Payer: Self-pay

## 2015-06-06 NOTE — Telephone Encounter (Signed)
Pt left v/m; pt has cough, h/a body aches and wants to know if can have med called in or does pt need to be seen. Left v/m requesting cb.

## 2015-06-12 ENCOUNTER — Other Ambulatory Visit: Payer: Self-pay

## 2015-06-12 DIAGNOSIS — B182 Chronic viral hepatitis C: Secondary | ICD-10-CM

## 2015-06-21 NOTE — Telephone Encounter (Signed)
Left v/m requesting cb.

## 2015-06-27 ENCOUNTER — Other Ambulatory Visit (HOSPITAL_COMMUNITY): Payer: Self-pay | Admitting: Nurse Practitioner

## 2015-06-27 DIAGNOSIS — B182 Chronic viral hepatitis C: Secondary | ICD-10-CM

## 2015-07-06 NOTE — Telephone Encounter (Signed)
Unable to reach pt by phone letter sent.

## 2015-07-26 ENCOUNTER — Ambulatory Visit (HOSPITAL_COMMUNITY)
Admission: RE | Admit: 2015-07-26 | Discharge: 2015-07-26 | Disposition: A | Discharge status: Home / Self Care | Payer: BLUE CROSS/BLUE SHIELD | Source: Ambulatory Visit | Attending: Nurse Practitioner | Admitting: Nurse Practitioner

## 2015-07-26 DIAGNOSIS — B182 Chronic viral hepatitis C: Secondary | ICD-10-CM | POA: Insufficient documentation

## 2015-09-02 ENCOUNTER — Other Ambulatory Visit: Payer: Self-pay | Admitting: Internal Medicine

## 2015-09-20 ENCOUNTER — Telehealth: Payer: Self-pay

## 2015-09-20 NOTE — Telephone Encounter (Signed)
Please call him and set him up for Hep B series He should get it in the standard 3 series (0, 1, and 6 months)

## 2015-09-20 NOTE — Telephone Encounter (Signed)
Pt left copy of labs dated 06/27/15 from [facility] at front desk; report in Dr Alla German in basket.

## 2015-09-21 NOTE — Telephone Encounter (Signed)
Left message to call back. He just needs to be placed on the nurse schedule since Dr Silvio Pate has advised what he needs

## 2015-09-21 NOTE — Telephone Encounter (Signed)
Thank You.

## 2015-09-21 NOTE — Telephone Encounter (Signed)
Appointment 7/27 °Pt aware °

## 2015-09-27 ENCOUNTER — Ambulatory Visit (INDEPENDENT_AMBULATORY_CARE_PROVIDER_SITE_OTHER): Payer: BLUE CROSS/BLUE SHIELD

## 2015-09-27 DIAGNOSIS — Z23 Encounter for immunization: Secondary | ICD-10-CM | POA: Diagnosis not present

## 2015-11-28 ENCOUNTER — Ambulatory Visit (INDEPENDENT_AMBULATORY_CARE_PROVIDER_SITE_OTHER): Payer: BLUE CROSS/BLUE SHIELD

## 2015-11-28 DIAGNOSIS — Z23 Encounter for immunization: Secondary | ICD-10-CM

## 2016-04-01 ENCOUNTER — Ambulatory Visit: Payer: BLUE CROSS/BLUE SHIELD

## 2016-04-08 ENCOUNTER — Ambulatory Visit (INDEPENDENT_AMBULATORY_CARE_PROVIDER_SITE_OTHER): Payer: BLUE CROSS/BLUE SHIELD

## 2016-04-08 DIAGNOSIS — Z23 Encounter for immunization: Secondary | ICD-10-CM | POA: Diagnosis not present

## 2016-04-09 ENCOUNTER — Other Ambulatory Visit: Payer: Self-pay | Admitting: Internal Medicine

## 2016-05-05 ENCOUNTER — Encounter: Payer: BLUE CROSS/BLUE SHIELD | Admitting: Internal Medicine

## 2016-05-12 ENCOUNTER — Ambulatory Visit (INDEPENDENT_AMBULATORY_CARE_PROVIDER_SITE_OTHER): Payer: BLUE CROSS/BLUE SHIELD | Admitting: Internal Medicine

## 2016-05-12 ENCOUNTER — Encounter: Payer: Self-pay | Admitting: Internal Medicine

## 2016-05-12 VITALS — BP 112/70 | HR 82 | Temp 98.1°F | Ht 67.0 in | Wt 167.0 lb

## 2016-05-12 DIAGNOSIS — B182 Chronic viral hepatitis C: Secondary | ICD-10-CM | POA: Diagnosis not present

## 2016-05-12 DIAGNOSIS — Z0000 Encounter for general adult medical examination without abnormal findings: Secondary | ICD-10-CM | POA: Diagnosis not present

## 2016-05-12 DIAGNOSIS — Z125 Encounter for screening for malignant neoplasm of prostate: Secondary | ICD-10-CM

## 2016-05-12 DIAGNOSIS — K219 Gastro-esophageal reflux disease without esophagitis: Secondary | ICD-10-CM

## 2016-05-12 DIAGNOSIS — Z1211 Encounter for screening for malignant neoplasm of colon: Secondary | ICD-10-CM

## 2016-05-12 LAB — PSA: PSA: 1.6 ng/mL (ref <=4.0)

## 2016-05-12 MED ORDER — SILDENAFIL CITRATE 20 MG PO TABS: ORAL_TABLET | ORAL | 11 refills | Status: DC

## 2016-05-12 NOTE — Addendum Note (Signed)
Addended by: Ellamae Sia on: 05/12/2016 12:54 PM   Modules accepted: Orders

## 2016-05-12 NOTE — Assessment & Plan Note (Signed)
Has been treated and considered cured

## 2016-05-12 NOTE — Assessment & Plan Note (Signed)
Fairly quiet on the PPI

## 2016-05-12 NOTE — Addendum Note (Signed)
Addended by: Ellamae Sia on: 05/12/2016 01:21 PM   Modules accepted: Orders

## 2016-05-12 NOTE — Assessment & Plan Note (Signed)
Healthy Discussed fitness Will check PSA after discussion FIT
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
## 2016-05-12 NOTE — Progress Notes (Signed)
Pre visit review using our clinic review tool, if applicable. No additional management support is needed unless otherwise documented below in the visit note. 

## 2016-05-13 LAB — COMPREHENSIVE METABOLIC PANEL
ALK PHOS: 48 U/L (ref 40–115)
ALT: 10 U/L (ref 9–46)
AST: 15 U/L (ref 10–35)
Albumin: 4.3 g/dL (ref 3.6–5.1)
BILIRUBIN TOTAL: 0.6 mg/dL (ref 0.2–1.2)
BUN: 16 mg/dL (ref 7–25)
CO2: 27 mmol/L (ref 20–31)
CREATININE: 1.06 mg/dL (ref 0.70–1.25)
Calcium: 9.6 mg/dL (ref 8.6–10.3)
Chloride: 104 mmol/L (ref 98–110)
GLUCOSE: 94 mg/dL (ref 65–99)
POTASSIUM: 4.6 mmol/L (ref 3.5–5.3)
SODIUM: 142 mmol/L (ref 135–146)
TOTAL PROTEIN: 7.3 g/dL (ref 6.1–8.1)

## 2016-05-13 LAB — CBC WITH DIFFERENTIAL/PLATELET
BASOS ABS: 0 {cells}/uL (ref 0–200)
Basophils Relative: 0 %
EOS ABS: 180 {cells}/uL (ref 15–500)
EOS PCT: 4 %
HCT: 46.8 % (ref 38.5–50.0)
Hemoglobin: 15.4 g/dL (ref 13.2–17.1)
LYMPHS PCT: 37 %
Lymphs Abs: 1665 cells/uL (ref 850–3900)
MCH: 30.3 pg (ref 27.0–33.0)
MCHC: 32.9 g/dL (ref 32.0–36.0)
MCV: 92.1 fL (ref 80.0–100.0)
MONOS PCT: 14 %
MPV: 12.1 fL (ref 7.5–12.5)
Monocytes Absolute: 630 cells/uL (ref 200–950)
NEUTROS PCT: 45 %
Neutro Abs: 2025 cells/uL (ref 1500–7800)
PLATELETS: 221 10*3/uL (ref 140–400)
RBC: 5.08 MIL/uL (ref 4.20–5.80)
RDW: 13.7 % (ref 11.0–15.0)
WBC: 4.5 10*3/uL (ref 3.8–10.8)

## 2016-08-08 ENCOUNTER — Emergency Department (HOSPITAL_COMMUNITY): Payer: BLUE CROSS/BLUE SHIELD

## 2016-08-08 ENCOUNTER — Encounter (HOSPITAL_COMMUNITY): Payer: Self-pay

## 2016-08-08 ENCOUNTER — Inpatient Hospital Stay (HOSPITAL_COMMUNITY)
Admission: EM | Admit: 2016-08-08 | Discharge: 2016-08-13 | DRG: 493 | Disposition: A | Discharge status: Rehabilitation Facility | Payer: BLUE CROSS/BLUE SHIELD | Attending: Emergency Medicine | Admitting: Emergency Medicine

## 2016-08-08 DIAGNOSIS — S82872A Displaced pilon fracture of left tibia, initial encounter for closed fracture: Principal | ICD-10-CM | POA: Diagnosis present

## 2016-08-08 DIAGNOSIS — S42009D Fracture of unspecified part of unspecified clavicle, subsequent encounter for fracture with routine healing: Secondary | ICD-10-CM | POA: Diagnosis not present

## 2016-08-08 DIAGNOSIS — M25572 Pain in left ankle and joints of left foot: Secondary | ICD-10-CM | POA: Diagnosis present

## 2016-08-08 DIAGNOSIS — S82302S Unspecified fracture of lower end of left tibia, sequela: Secondary | ICD-10-CM | POA: Diagnosis not present

## 2016-08-08 DIAGNOSIS — B182 Chronic viral hepatitis C: Secondary | ICD-10-CM | POA: Diagnosis present

## 2016-08-08 DIAGNOSIS — S2242XA Multiple fractures of ribs, left side, initial encounter for closed fracture: Secondary | ICD-10-CM | POA: Diagnosis present

## 2016-08-08 DIAGNOSIS — I10 Essential (primary) hypertension: Secondary | ICD-10-CM | POA: Diagnosis not present

## 2016-08-08 DIAGNOSIS — S82402D Unspecified fracture of shaft of left fibula, subsequent encounter for closed fracture with routine healing: Secondary | ICD-10-CM | POA: Diagnosis not present

## 2016-08-08 DIAGNOSIS — S82872D Displaced pilon fracture of left tibia, subsequent encounter for closed fracture with routine healing: Secondary | ICD-10-CM | POA: Diagnosis not present

## 2016-08-08 DIAGNOSIS — S42002A Fracture of unspecified part of left clavicle, initial encounter for closed fracture: Secondary | ICD-10-CM | POA: Diagnosis present

## 2016-08-08 DIAGNOSIS — B1920 Unspecified viral hepatitis C without hepatic coma: Secondary | ICD-10-CM | POA: Diagnosis not present

## 2016-08-08 DIAGNOSIS — S27321D Contusion of lung, unilateral, subsequent encounter: Secondary | ICD-10-CM | POA: Diagnosis not present

## 2016-08-08 DIAGNOSIS — J939 Pneumothorax, unspecified: Secondary | ICD-10-CM | POA: Diagnosis not present

## 2016-08-08 DIAGNOSIS — K5903 Drug induced constipation: Secondary | ICD-10-CM

## 2016-08-08 DIAGNOSIS — S82899A Other fracture of unspecified lower leg, initial encounter for closed fracture: Secondary | ICD-10-CM | POA: Diagnosis not present

## 2016-08-08 DIAGNOSIS — S82302A Unspecified fracture of lower end of left tibia, initial encounter for closed fracture: Secondary | ICD-10-CM | POA: Diagnosis not present

## 2016-08-08 DIAGNOSIS — S82832A Other fracture of upper and lower end of left fibula, initial encounter for closed fracture: Secondary | ICD-10-CM | POA: Diagnosis present

## 2016-08-08 DIAGNOSIS — S42102A Fracture of unspecified part of scapula, left shoulder, initial encounter for closed fracture: Secondary | ICD-10-CM | POA: Diagnosis present

## 2016-08-08 DIAGNOSIS — G8918 Other acute postprocedural pain: Secondary | ICD-10-CM | POA: Diagnosis not present

## 2016-08-08 DIAGNOSIS — K5900 Constipation, unspecified: Secondary | ICD-10-CM | POA: Diagnosis not present

## 2016-08-08 DIAGNOSIS — S82871A Displaced pilon fracture of right tibia, initial encounter for closed fracture: Secondary | ICD-10-CM | POA: Diagnosis not present

## 2016-08-08 DIAGNOSIS — S8265XA Nondisplaced fracture of lateral malleolus of left fibula, initial encounter for closed fracture: Secondary | ICD-10-CM | POA: Diagnosis not present

## 2016-08-08 DIAGNOSIS — R52 Pain, unspecified: Secondary | ICD-10-CM

## 2016-08-08 DIAGNOSIS — R739 Hyperglycemia, unspecified: Secondary | ICD-10-CM | POA: Diagnosis not present

## 2016-08-08 DIAGNOSIS — Z87891 Personal history of nicotine dependence: Secondary | ICD-10-CM

## 2016-08-08 DIAGNOSIS — S42102D Fracture of unspecified part of scapula, left shoulder, subsequent encounter for fracture with routine healing: Secondary | ICD-10-CM | POA: Diagnosis not present

## 2016-08-08 DIAGNOSIS — K219 Gastro-esophageal reflux disease without esophagitis: Secondary | ICD-10-CM | POA: Diagnosis present

## 2016-08-08 DIAGNOSIS — T1490XA Injury, unspecified, initial encounter: Secondary | ICD-10-CM

## 2016-08-08 DIAGNOSIS — S82402A Unspecified fracture of shaft of left fibula, initial encounter for closed fracture: Secondary | ICD-10-CM | POA: Diagnosis not present

## 2016-08-08 DIAGNOSIS — S27321A Contusion of lung, unilateral, initial encounter: Secondary | ICD-10-CM | POA: Diagnosis present

## 2016-08-08 DIAGNOSIS — S2242XD Multiple fractures of ribs, left side, subsequent encounter for fracture with routine healing: Secondary | ICD-10-CM | POA: Diagnosis not present

## 2016-08-08 DIAGNOSIS — D62 Acute posthemorrhagic anemia: Secondary | ICD-10-CM | POA: Diagnosis not present

## 2016-08-08 DIAGNOSIS — S82402S Unspecified fracture of shaft of left fibula, sequela: Secondary | ICD-10-CM | POA: Diagnosis not present

## 2016-08-08 DIAGNOSIS — S270XXA Traumatic pneumothorax, initial encounter: Secondary | ICD-10-CM | POA: Diagnosis present

## 2016-08-08 DIAGNOSIS — S8263XA Displaced fracture of lateral malleolus of unspecified fibula, initial encounter for closed fracture: Secondary | ICD-10-CM | POA: Diagnosis not present

## 2016-08-08 DIAGNOSIS — S82872S Displaced pilon fracture of left tibia, sequela: Secondary | ICD-10-CM | POA: Insufficient documentation

## 2016-08-08 DIAGNOSIS — Y92410 Unspecified street and highway as the place of occurrence of the external cause: Secondary | ICD-10-CM

## 2016-08-08 DIAGNOSIS — S82832S Other fracture of upper and lower end of left fibula, sequela: Secondary | ICD-10-CM | POA: Diagnosis not present

## 2016-08-08 DIAGNOSIS — S82832D Other fracture of upper and lower end of left fibula, subsequent encounter for closed fracture with routine healing: Secondary | ICD-10-CM | POA: Diagnosis present

## 2016-08-08 DIAGNOSIS — E785 Hyperlipidemia, unspecified: Secondary | ICD-10-CM | POA: Diagnosis present

## 2016-08-08 DIAGNOSIS — Z79899 Other long term (current) drug therapy: Secondary | ICD-10-CM | POA: Diagnosis not present

## 2016-08-08 LAB — CBC
HCT: 41.1 % (ref 39.0–52.0)
Hemoglobin: 14 g/dL (ref 13.0–17.0)
MCH: 31.6 pg (ref 26.0–34.0)
MCHC: 34.1 g/dL (ref 30.0–36.0)
MCV: 92.8 fL (ref 78.0–100.0)
PLATELETS: 204 10*3/uL (ref 150–400)
RBC: 4.43 MIL/uL (ref 4.22–5.81)
RDW: 13.1 % (ref 11.5–15.5)
WBC: 6 10*3/uL (ref 4.0–10.5)

## 2016-08-08 LAB — URINALYSIS, ROUTINE W REFLEX MICROSCOPIC
BILIRUBIN URINE: NEGATIVE
GLUCOSE, UA: NEGATIVE mg/dL
HGB URINE DIPSTICK: NEGATIVE
KETONES UR: 5 mg/dL — AB
LEUKOCYTES UA: NEGATIVE
Nitrite: NEGATIVE
PH: 6 (ref 5.0–8.0)
PROTEIN: NEGATIVE mg/dL
Specific Gravity, Urine: 1.035 — ABNORMAL HIGH (ref 1.005–1.030)

## 2016-08-08 LAB — COMPREHENSIVE METABOLIC PANEL
ALK PHOS: 43 U/L (ref 38–126)
ALT: 20 U/L (ref 17–63)
AST: 26 U/L (ref 15–41)
Albumin: 3.8 g/dL (ref 3.5–5.0)
Anion gap: 5 (ref 5–15)
BUN: 14 mg/dL (ref 6–20)
CALCIUM: 9.3 mg/dL (ref 8.9–10.3)
CO2: 26 mmol/L (ref 22–32)
CREATININE: 1.01 mg/dL (ref 0.61–1.24)
Chloride: 109 mmol/L (ref 101–111)
GFR calc non Af Amer: >60 mL/min (ref >60)
Glucose, Bld: 161 mg/dL — ABNORMAL HIGH (ref 65–99)
Potassium: 3.9 mmol/L (ref 3.5–5.1)
Sodium: 140 mmol/L (ref 135–145)
Total Bilirubin: 0.8 mg/dL (ref 0.3–1.2)
Total Protein: 6.9 g/dL (ref 6.5–8.1)

## 2016-08-08 LAB — PROTIME-INR
INR: 1.01
Prothrombin Time: 13.3 seconds (ref 11.4–15.2)

## 2016-08-08 LAB — SAMPLE TO BLOOD BANK

## 2016-08-08 LAB — ETHANOL

## 2016-08-08 MED ORDER — IPRATROPIUM-ALBUTEROL 0.5-2.5 (3) MG/3ML IN SOLN: 3.0000 mL | Freq: Four times a day (QID) | RESPIRATORY_TRACT | Status: DC | PRN

## 2016-08-08 MED ORDER — PNEUMOCOCCAL VAC POLYVALENT 25 MCG/0.5ML IJ INJ: 0.5000 mL | INJECTION | INTRAMUSCULAR | Status: DC

## 2016-08-08 MED ORDER — HYDROMORPHONE HCL 1 MG/ML IJ SOLN
1.0000 mg | Freq: Once | INTRAMUSCULAR | Status: AC
  Administered 2016-08-08: 1 mg via INTRAVENOUS
  Filled 2016-08-08: qty 1

## 2016-08-08 MED ORDER — PANTOPRAZOLE SODIUM 40 MG IV SOLR: 40.0000 mg | Freq: Every day | INTRAVENOUS | Status: DC

## 2016-08-08 MED ORDER — KCL IN DEXTROSE-NACL 20-5-0.45 MEQ/L-%-% IV SOLN
INTRAVENOUS | Status: DC
  Administered 2016-08-08: 23:00:00 via INTRAVENOUS
  Filled 2016-08-08: qty 1000

## 2016-08-08 MED ORDER — CHLORHEXIDINE GLUCONATE 4 % EX LIQD
60.0000 mL | Freq: Once | CUTANEOUS | Status: DC
  Filled 2016-08-08: qty 60

## 2016-08-08 MED ORDER — HYDROMORPHONE HCL 1 MG/ML IJ SOLN
2.0000 mg | INTRAMUSCULAR | Status: DC | PRN
  Administered 2016-08-09 – 2016-08-13 (×15): 2 mg via INTRAVENOUS
  Filled 2016-08-08 (×16): qty 2

## 2016-08-08 MED ORDER — CEFAZOLIN SODIUM-DEXTROSE 2-4 GM/100ML-% IV SOLN
2.0000 g | INTRAVENOUS | Status: AC
Start: 2016-08-09 — End: 2016-08-09
  Administered 2016-08-09: 2 g via INTRAVENOUS
  Filled 2016-08-08: qty 100

## 2016-08-08 MED ORDER — MORPHINE SULFATE (PF) 4 MG/ML IV SOLN
4.0000 mg | Freq: Once | INTRAVENOUS | Status: AC
  Administered 2016-08-08: 4 mg via INTRAVENOUS
  Filled 2016-08-08: qty 1

## 2016-08-08 MED ORDER — PANTOPRAZOLE SODIUM 40 MG PO TBEC
40.0000 mg | DELAYED_RELEASE_TABLET | Freq: Every day | ORAL | Status: DC
  Administered 2016-08-10 – 2016-08-13 (×4): 40 mg via ORAL
  Filled 2016-08-08 (×4): qty 1

## 2016-08-08 MED ORDER — ONDANSETRON HCL 4 MG/2ML IJ SOLN: 4.0000 mg | Freq: Four times a day (QID) | INTRAMUSCULAR | Status: DC | PRN

## 2016-08-08 MED ORDER — ONDANSETRON HCL 4 MG PO TABS: 4.0000 mg | ORAL_TABLET | Freq: Four times a day (QID) | ORAL | Status: DC | PRN

## 2016-08-08 MED ORDER — HYDROMORPHONE HCL 1 MG/ML IJ SOLN
1.0000 mg | INTRAMUSCULAR | Status: DC | PRN
  Administered 2016-08-08 (×2): 1 mg via INTRAVENOUS
  Filled 2016-08-08: qty 1

## 2016-08-08 MED ORDER — POVIDONE-IODINE 10 % EX SWAB: 2.0000 "application " | Freq: Once | CUTANEOUS | Status: DC

## 2016-08-08 MED ORDER — IOPAMIDOL (ISOVUE-300) INJECTION 61%
INTRAVENOUS | Status: AC
Start: 2016-08-08 — End: 2016-08-08
  Administered 2016-08-08: 100 mL
  Filled 2016-08-08: qty 100

## 2016-08-08 MED ORDER — HYDROMORPHONE HCL 1 MG/ML IJ SOLN
INTRAMUSCULAR | Status: AC
  Filled 2016-08-08: qty 1

## 2016-08-08 MED ORDER — METHOCARBAMOL 1000 MG/10ML IJ SOLN
1000.0000 mg | Freq: Three times a day (TID) | INTRAVENOUS | Status: DC | PRN
  Filled 2016-08-08: qty 10

## 2016-08-08 NOTE — ED Notes (Signed)
Ortho tech paged  

## 2016-08-08 NOTE — ED Notes (Signed)
Pt returned from CT °

## 2016-08-08 NOTE — ED Notes (Signed)
Ortho techs at bedside.

## 2016-08-08 NOTE — ED Notes (Signed)
Pt in CT.

## 2016-08-08 NOTE — ED Notes (Signed)
Family at beside  

## 2016-08-08 NOTE — ED Notes (Signed)
Pt given ice water.
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
## 2016-08-08 NOTE — Progress Notes (Signed)
Orthopedic Tech Progress Note Patient Details:  Daryl Cook [facility] [birth year] [id number]  Ortho Devices Type of Ortho Device: Ace wrap, Short leg splint, Stirrup splint, Arm sling Ortho Device/Splint Interventions: Application   Daryl Cook 08/08/2016, 7:27 PM

## 2016-08-08 NOTE — ED Notes (Signed)
C-collar applied to pt by Junie Panning, RN and Charlett Nose, EMT.

## 2016-08-08 NOTE — ED Notes (Signed)
2L o2 [employer] applied to pt

## 2016-08-08 NOTE — ED Notes (Signed)
Portable xray at bedside.

## 2016-08-08 NOTE — ED Notes (Signed)
ED Provider at bedside. 

## 2016-08-08 NOTE — ED Provider Notes (Signed)
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 injection 1 mg ( Intravenous MAR Hold 08/09/16 0753)  HYDROmorphone (DILAUDID) injection 2 mg ( Intravenous MAR Hold 08/09/16 0753)  ondansetron (ZOFRAN) tablet 4 mg ( Oral MAR Hold 08/09/16 0753)    Or  ondansetron (ZOFRAN) injection 4 mg ( Intravenous MAR Hold 08/09/16 0753)  pantoprazole (PROTONIX) EC tablet 40 mg ( Oral Automatically Held 08/17/16 1000)    Or  pantoprazole (PROTONIX) injection 40 mg ( Intravenous Automatically Held 08/17/16 1000)  methocarbamol (ROBAXIN) 1,000 mg in dextrose 5 % 50 mL IVPB ( Intravenous MAR Hold 08/09/16 0753)  chlorhexidine (HIBICLENS) 4 % liquid 4 application (not administered)  povidone-iodine 10 % swab 2 application (not administered)  ceFAZolin (ANCEF) IVPB 2g/100 mL premix (not administered)  ipratropium-albuterol (DUONEB) 0.5-2.5 (3) MG/3ML nebulizer solution 3 mL ( Nebulization MAR Hold 08/09/16 0753)  pneumococcal 23 valent vaccine (PNU-IMMUNE) injection 0.5 mL ( Intramuscular Automatically Held 08/09/16 1000)  MEDLINE mouth rinse ( Mouth Rinse Automatically Held 08/18/16 2200)  lactated ringers infusion ( Intravenous New Bag/Given 08/09/16 0806)  morphine 4 MG/ML injection 4 mg (4 mg Intravenous Given 08/08/16  1630)  HYDROmorphone (DILAUDID) injection 1 mg (1 mg Intravenous Given 08/08/16 1710)  iopamidol (ISOVUE-300) 61 % injection (100 mLs  Contrast Given 08/08/16 1730)  HYDROmorphone (DILAUDID) injection 1 mg (1 mg Intravenous Given 08/08/16 1912)  midazolam (VERSED) 2 MG/2ML injection (  Override pull for Anesthesia 08/09/16 0846)  fentaNYL (SUBLIMAZE) 100 MCG/2ML injection (  Override pull for Anesthesia 08/09/16 0846)     NEW OUTPATIENT MEDICATIONS STARTED DURING THIS VISIT:  None   Note:  This document was prepared using Dragon voice recognition software and may include unintentional dictation errors.  Nanda Quinton, MD Emergency Medicine  Teja Judice, [facility], MD 08/09/16 [phone]

## 2016-08-08 NOTE — ED Notes (Signed)
Attempted report 
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
## 2016-08-09 ENCOUNTER — Encounter (HOSPITAL_COMMUNITY): Admission: EM | Disposition: A | Discharge status: Rehabilitation Facility | Payer: Self-pay | Source: Home / Self Care

## 2016-08-09 ENCOUNTER — Inpatient Hospital Stay: Admit: 2016-08-09 | Payer: BLUE CROSS/BLUE SHIELD | Admitting: Orthopedic Surgery

## 2016-08-09 ENCOUNTER — Inpatient Hospital Stay (HOSPITAL_COMMUNITY): Payer: BLUE CROSS/BLUE SHIELD | Admitting: Certified Registered"

## 2016-08-09 ENCOUNTER — Inpatient Hospital Stay (HOSPITAL_COMMUNITY): Payer: BLUE CROSS/BLUE SHIELD

## 2016-08-09 DIAGNOSIS — S8263XA Displaced fracture of lateral malleolus of unspecified fibula, initial encounter for closed fracture: Secondary | ICD-10-CM

## 2016-08-09 DIAGNOSIS — S8265XA Nondisplaced fracture of lateral malleolus of left fibula, initial encounter for closed fracture: Secondary | ICD-10-CM

## 2016-08-09 DIAGNOSIS — S82899A Other fracture of unspecified lower leg, initial encounter for closed fracture: Secondary | ICD-10-CM

## 2016-08-09 DIAGNOSIS — S82871A Displaced pilon fracture of right tibia, initial encounter for closed fracture: Secondary | ICD-10-CM

## 2016-08-09 HISTORY — PX: ORIF FIBULA FRACTURE: SHX5114

## 2016-08-09 LAB — CBC
HEMATOCRIT: 42.4 % (ref 39.0–52.0)
Hemoglobin: 13.9 g/dL (ref 13.0–17.0)
MCH: 31.2 pg (ref 26.0–34.0)
MCHC: 32.8 g/dL (ref 30.0–36.0)
MCV: 95.1 fL (ref 78.0–100.0)
PLATELETS: 198 10*3/uL (ref 150–400)
RBC: 4.46 MIL/uL (ref 4.22–5.81)
RDW: 13.5 % (ref 11.5–15.5)
WBC: 8.1 10*3/uL (ref 4.0–10.5)

## 2016-08-09 LAB — BASIC METABOLIC PANEL
Anion gap: 9 (ref 5–15)
BUN: 13 mg/dL (ref 6–20)
CALCIUM: 8.8 mg/dL — AB (ref 8.9–10.3)
CO2: 22 mmol/L (ref 22–32)
CREATININE: 0.93 mg/dL (ref 0.61–1.24)
Chloride: 104 mmol/L (ref 101–111)
GLUCOSE: 166 mg/dL — AB (ref 65–99)
Potassium: 4 mmol/L (ref 3.5–5.1)
Sodium: 135 mmol/L (ref 135–145)

## 2016-08-09 LAB — GLUCOSE, CAPILLARY
GLUCOSE-CAPILLARY: 201 mg/dL — AB (ref 65–99)
GLUCOSE-CAPILLARY: 145 mg/dL — AB (ref 65–99)

## 2016-08-09 LAB — HIV ANTIBODY (ROUTINE TESTING W REFLEX): HIV SCREEN 4TH GENERATION: NONREACTIVE

## 2016-08-09 LAB — SURGICAL PCR SCREEN
MRSA, PCR: NEGATIVE
Staphylococcus aureus: NEGATIVE

## 2016-08-09 SURGERY — OPEN REDUCTION INTERNAL FIXATION (ORIF) FIBULA FRACTURE
Anesthesia: General | Laterality: Left

## 2016-08-09 MED ORDER — ONDANSETRON HCL 4 MG/2ML IJ SOLN: 4.0000 mg | Freq: Four times a day (QID) | INTRAMUSCULAR | Status: DC | PRN

## 2016-08-09 MED ORDER — DEXAMETHASONE SODIUM PHOSPHATE 10 MG/ML IJ SOLN
INTRAMUSCULAR | Status: DC | PRN
  Administered 2016-08-09: 10 mg via INTRAVENOUS

## 2016-08-09 MED ORDER — PHENYLEPHRINE HCL 10 MG/ML IJ SOLN
INTRAMUSCULAR | Status: DC | PRN
  Administered 2016-08-09: 200 ug via INTRAVENOUS
  Administered 2016-08-09: 120 ug via INTRAVENOUS

## 2016-08-09 MED ORDER — SODIUM CHLORIDE 0.9 % IV SOLN
INTRAVENOUS | Status: DC
  Administered 2016-08-09: 10 mL/h via INTRAVENOUS

## 2016-08-09 MED ORDER — METHOCARBAMOL 1000 MG/10ML IJ SOLN
500.0000 mg | Freq: Four times a day (QID) | INTRAMUSCULAR | Status: DC | PRN
  Administered 2016-08-10: 500 mg via INTRAVENOUS
  Filled 2016-08-09: qty 5

## 2016-08-09 MED ORDER — PHENYLEPHRINE HCL 10 MG/ML IJ SOLN
INTRAVENOUS | Status: DC | PRN
  Administered 2016-08-09: 30 ug/min via INTRAVENOUS

## 2016-08-09 MED ORDER — ONDANSETRON HCL 4 MG PO TABS: 4.0000 mg | ORAL_TABLET | Freq: Four times a day (QID) | ORAL | Status: DC | PRN

## 2016-08-09 MED ORDER — ACETAMINOPHEN 325 MG PO TABS: 650.0000 mg | ORAL_TABLET | Freq: Four times a day (QID) | ORAL | Status: DC | PRN

## 2016-08-09 MED ORDER — ACETAMINOPHEN 650 MG RE SUPP
650.0000 mg | Freq: Four times a day (QID) | RECTAL | Status: DC | PRN
Start: 2016-08-09 — End: 2016-08-11

## 2016-08-09 MED ORDER — 0.9 % SODIUM CHLORIDE (POUR BTL) OPTIME
TOPICAL | Status: DC | PRN
  Administered 2016-08-09: 500 mL

## 2016-08-09 MED ORDER — CEFAZOLIN SODIUM-DEXTROSE 1-4 GM/50ML-% IV SOLN
1.0000 g | Freq: Four times a day (QID) | INTRAVENOUS | Status: AC
  Administered 2016-08-09 – 2016-08-10 (×3): 1 g via INTRAVENOUS
  Filled 2016-08-09 (×3): qty 50

## 2016-08-09 MED ORDER — OXYCODONE HCL 5 MG PO TABS
5.0000 mg | ORAL_TABLET | ORAL | Status: DC | PRN
  Administered 2016-08-09 – 2016-08-10 (×4): 10 mg via ORAL
  Filled 2016-08-09 (×4): qty 2

## 2016-08-09 MED ORDER — BISACODYL 10 MG RE SUPP: 10.0000 mg | Freq: Every day | RECTAL | Status: DC | PRN

## 2016-08-09 MED ORDER — PROPOFOL 10 MG/ML IV BOLUS
INTRAVENOUS | Status: AC
  Filled 2016-08-09: qty 20

## 2016-08-09 MED ORDER — FENTANYL CITRATE (PF) 100 MCG/2ML IJ SOLN
INTRAMUSCULAR | Status: DC | PRN
  Administered 2016-08-09 (×2): 50 ug via INTRAVENOUS

## 2016-08-09 MED ORDER — LIDOCAINE 2% (20 MG/ML) 5 ML SYRINGE
INTRAMUSCULAR | Status: AC
  Filled 2016-08-09: qty 5

## 2016-08-09 MED ORDER — PROPOFOL 10 MG/ML IV BOLUS
INTRAVENOUS | Status: DC | PRN
  Administered 2016-08-09: 150 mg via INTRAVENOUS

## 2016-08-09 MED ORDER — FENTANYL CITRATE (PF) 250 MCG/5ML IJ SOLN
INTRAMUSCULAR | Status: AC
  Filled 2016-08-09: qty 5

## 2016-08-09 MED ORDER — METHOCARBAMOL 500 MG PO TABS
500.0000 mg | ORAL_TABLET | Freq: Four times a day (QID) | ORAL | Status: DC | PRN
  Administered 2016-08-10 – 2016-08-13 (×4): 500 mg via ORAL
  Filled 2016-08-09 (×4): qty 1

## 2016-08-09 MED ORDER — POLYETHYLENE GLYCOL 3350 17 G PO PACK
17.0000 g | PACK | Freq: Every day | ORAL | Status: DC | PRN
  Administered 2016-08-12: 17 g via ORAL
  Filled 2016-08-09: qty 1

## 2016-08-09 MED ORDER — METOCLOPRAMIDE HCL 5 MG PO TABS: 5.0000 mg | ORAL_TABLET | Freq: Three times a day (TID) | ORAL | Status: DC | PRN

## 2016-08-09 MED ORDER — METOCLOPRAMIDE HCL 5 MG/ML IJ SOLN
5.0000 mg | Freq: Three times a day (TID) | INTRAMUSCULAR | Status: DC | PRN
  Administered 2016-08-10: 10 mg via INTRAVENOUS
  Filled 2016-08-09: qty 2

## 2016-08-09 MED ORDER — ALBUMIN HUMAN 5 % IV SOLN
INTRAVENOUS | Status: DC | PRN
  Administered 2016-08-09: 10:00:00 via INTRAVENOUS

## 2016-08-09 MED ORDER — LACTATED RINGERS IV SOLN
INTRAVENOUS | Status: DC
  Administered 2016-08-09 (×2): via INTRAVENOUS

## 2016-08-09 MED ORDER — MIDAZOLAM HCL 2 MG/2ML IJ SOLN
INTRAMUSCULAR | Status: AC
Start: 2016-08-09 — End: 2016-08-09
  Filled 2016-08-09: qty 2

## 2016-08-09 MED ORDER — DOCUSATE SODIUM 100 MG PO CAPS
100.0000 mg | ORAL_CAPSULE | Freq: Two times a day (BID) | ORAL | Status: DC
  Administered 2016-08-09 – 2016-08-13 (×8): 100 mg via ORAL
  Filled 2016-08-09 (×8): qty 1

## 2016-08-09 MED ORDER — HYDROMORPHONE HCL 1 MG/ML IJ SOLN: 0.2500 mg | INTRAMUSCULAR | Status: DC | PRN

## 2016-08-09 MED ORDER — LIDOCAINE HCL (CARDIAC) 20 MG/ML IV SOLN
INTRAVENOUS | Status: DC | PRN
  Administered 2016-08-09: 40 mg via INTRAVENOUS

## 2016-08-09 MED ORDER — ORAL CARE MOUTH RINSE
15.0000 mL | Freq: Two times a day (BID) | OROMUCOSAL | Status: DC
  Administered 2016-08-10 – 2016-08-13 (×2): 15 mL via OROMUCOSAL

## 2016-08-09 MED ORDER — ENOXAPARIN SODIUM 30 MG/0.3ML ~~LOC~~ SOLN
30.0000 mg | Freq: Two times a day (BID) | SUBCUTANEOUS | Status: DC
  Administered 2016-08-09 – 2016-08-13 (×8): 30 mg via SUBCUTANEOUS
  Filled 2016-08-09 (×8): qty 0.3

## 2016-08-09 MED ORDER — ONDANSETRON HCL 4 MG/2ML IJ SOLN
INTRAMUSCULAR | Status: DC | PRN
  Administered 2016-08-09: 4 mg via INTRAVENOUS

## 2016-08-09 MED ORDER — EPHEDRINE SULFATE 50 MG/ML IJ SOLN
INTRAMUSCULAR | Status: DC | PRN
  Administered 2016-08-09 (×3): 10 mg via INTRAVENOUS

## 2016-08-09 MED ORDER — ROCURONIUM BROMIDE 10 MG/ML (PF) SYRINGE
PREFILLED_SYRINGE | INTRAVENOUS | Status: AC
  Filled 2016-08-09: qty 5

## 2016-08-09 MED ORDER — MAGNESIUM CITRATE PO SOLN
1.0000 | Freq: Once | ORAL | Status: AC | PRN
  Administered 2016-08-13: 1 via ORAL
  Filled 2016-08-09 (×2): qty 296

## 2016-08-09 MED ORDER — HYDROMORPHONE HCL 1 MG/ML IJ SOLN
1.0000 mg | INTRAMUSCULAR | Status: DC | PRN
  Administered 2016-08-09 – 2016-08-13 (×10): 1 mg via INTRAVENOUS
  Filled 2016-08-09 (×9): qty 1

## 2016-08-09 MED ORDER — KETOROLAC TROMETHAMINE 15 MG/ML IJ SOLN
15.0000 mg | Freq: Four times a day (QID) | INTRAMUSCULAR | Status: AC
  Administered 2016-08-09 – 2016-08-10 (×4): 15 mg via INTRAVENOUS
  Filled 2016-08-09 (×4): qty 1

## 2016-08-09 MED ORDER — SUCCINYLCHOLINE CHLORIDE 200 MG/10ML IV SOSY
PREFILLED_SYRINGE | INTRAVENOUS | Status: AC
  Filled 2016-08-09: qty 10

## 2016-08-09 MED ORDER — FENTANYL CITRATE (PF) 100 MCG/2ML IJ SOLN
INTRAMUSCULAR | Status: AC
  Filled 2016-08-09: qty 2

## 2016-08-09 MED ORDER — MIDAZOLAM HCL 2 MG/2ML IJ SOLN
INTRAMUSCULAR | Status: AC
  Filled 2016-08-09: qty 2

## 2016-08-09 MED ORDER — BUPIVACAINE HCL (PF) 0.5 % IJ SOLN
INTRAMUSCULAR | Status: DC | PRN
  Administered 2016-08-09: 10 mL via PERINEURAL
  Administered 2016-08-09: 30 mL via PERINEURAL

## 2016-08-09 MED ORDER — MIDAZOLAM HCL 5 MG/5ML IJ SOLN
INTRAMUSCULAR | Status: DC | PRN
  Administered 2016-08-09 (×2): 1 mg via INTRAVENOUS

## 2016-08-09 SURGICAL SUPPLY — 47 items
BANDAGE ESMARK 6X9 LF (GAUZE/BANDAGES/DRESSINGS) IMPLANT
BIT DRILL 2.5X110 QC LCP DISP (BIT) ×3 IMPLANT
BIT DRILL LCP QC 2X140 (BIT) ×3 IMPLANT
BNDG COHESIVE 4X5 TAN STRL (GAUZE/BANDAGES/DRESSINGS) ×3 IMPLANT
BNDG ESMARK 6X9 LF (GAUZE/BANDAGES/DRESSINGS)
BNDG GAUZE ELAST 4 BULKY (GAUZE/BANDAGES/DRESSINGS) ×3 IMPLANT
COVER SURGICAL LIGHT HANDLE (MISCELLANEOUS) ×6 IMPLANT
DRAPE OEC MINIVIEW 54X84 (DRAPES) ×3 IMPLANT
DRAPE U-SHAPE 47X51 STRL (DRAPES) ×3 IMPLANT
DRSG ADAPTIC 3X8 NADH LF (GAUZE/BANDAGES/DRESSINGS) ×3 IMPLANT
DRSG PAD ABDOMINAL 8X10 ST (GAUZE/BANDAGES/DRESSINGS) ×3 IMPLANT
DURAPREP 26ML APPLICATOR (WOUND CARE) ×3 IMPLANT
ELECT REM PT RETURN 9FT ADLT (ELECTROSURGICAL) ×3
ELECTRODE REM PT RTRN 9FT ADLT (ELECTROSURGICAL) ×1 IMPLANT
GAUZE SPONGE 4X4 12PLY STRL (GAUZE/BANDAGES/DRESSINGS) ×3 IMPLANT
GLOVE BIOGEL PI IND STRL 9 (GLOVE) ×1 IMPLANT
GLOVE BIOGEL PI INDICATOR 9 (GLOVE) ×2
GLOVE SURG ORTHO 9.0 STRL STRW (GLOVE) ×6 IMPLANT
GOWN STRL REUS W/ TWL XL LVL3 (GOWN DISPOSABLE) ×3 IMPLANT
GOWN STRL REUS W/TWL XL LVL3 (GOWN DISPOSABLE) ×6
KIT BASIN OR (CUSTOM PROCEDURE TRAY) ×3 IMPLANT
KIT DRSG PREVENA PLUS 7DAY 125 (MISCELLANEOUS) ×3 IMPLANT
KIT ROOM TURNOVER OR (KITS) ×3 IMPLANT
MANIFOLD NEPTUNE II (INSTRUMENTS) ×3 IMPLANT
NS IRRIG 1000ML POUR BTL (IV SOLUTION) ×3 IMPLANT
PACK ORTHO EXTREMITY (CUSTOM PROCEDURE TRAY) ×3 IMPLANT
PAD ARMBOARD 7.5X6 YLW CONV (MISCELLANEOUS) ×3 IMPLANT
PLATE 8 HOLE LEFT DISTAL TIBIA (Plate) ×3 IMPLANT
PLATE BONE COMPRESSION 4-HOLE (Plate) ×3 IMPLANT
SCREW CORT LP ST 3.5X14 (Screw) ×3 IMPLANT
SCREW CORTEX LOW PRO 3.5X26 (Screw) ×9 IMPLANT
SCREW LOCK VA ST 2.7X12 (Screw) ×9 IMPLANT
SCREW LOCKING VA 2.7X40MM (Screw) ×3 IMPLANT
SCREW LOCKING VA 2.7X46 (Screw) ×3 IMPLANT
SCREW LOCKING VA 2.7X50MM (Screw) ×3 IMPLANT
SCREW SELF TAP 12M (Screw) ×6 IMPLANT
STAPLER VISISTAT 35W (STAPLE) IMPLANT
SUCTION FRAZIER HANDLE 10FR (MISCELLANEOUS) ×2
SUCTION TUBE FRAZIER 10FR DISP (MISCELLANEOUS) ×1 IMPLANT
SUT ETHILON 2 0 PSLX (SUTURE) IMPLANT
SUT VIC AB 2-0 CT1 27 (SUTURE) ×2
SUT VIC AB 2-0 CT1 TAPERPNT 27 (SUTURE) ×1 IMPLANT
TOWEL OR 17X24 6PK STRL BLUE (TOWEL DISPOSABLE) ×3 IMPLANT
TOWEL OR 17X26 10 PK STRL BLUE (TOWEL DISPOSABLE) ×3 IMPLANT
TUBE CONNECTING 12'X1/4 (SUCTIONS) ×1
TUBE CONNECTING 12X1/4 (SUCTIONS) ×2 IMPLANT
YANKAUER SUCT BULB TIP NO VENT (SUCTIONS) ×3 IMPLANT

## 2016-08-09 NOTE — Anesthesia Procedure Notes (Signed)
Anesthesia Regional Block: Popliteal block   Pre-Anesthetic Checklist: ,, timeout performed, Correct Patient, Correct Site, Correct Laterality, Correct Procedure, Correct Position, site marked, Risks and benefits discussed, pre-op evaluation,  At surgeon's request and post-op pain management  Laterality: Left  Prep: Maximum Sterile Barrier Precautions used, chloraprep       Needles:  Injection technique: Single-shot  Needle Type: Echogenic Stimulator Needle     Needle Length: 9cm  Needle Gauge: 21     Additional Needles:   Procedures: ultrasound guided,,,,,,,,  Narrative:  Start time: 08/09/2016 9:45 AM End time: 08/09/2016 9:57 AM Injection made incrementally with aspirations every 5 mL. Anesthesiologist: Roderic Palau  Additional Notes:  Adductor canal block in addition with 10cc of 0.5% Bupivicaine plain.

## 2016-08-09 NOTE — Anesthesia Procedure Notes (Signed)
Procedure Name: LMA Insertion Date/Time: 08/09/2016 9:26 AM Performed by: Lance Coon Pre-anesthesia Checklist: Patient identified, Emergency Drugs available, Suction available, Patient being monitored and Timeout performed Patient Re-evaluated:Patient Re-evaluated prior to inductionOxygen Delivery Method: Circle system utilized Preoxygenation: Pre-oxygenation with 100% oxygen Intubation Type: IV induction LMA: LMA inserted LMA Size: 4.0 Number of attempts: 1 Placement Confirmation: positive ETCO2 and breath sounds checked- equal and bilateral Tube secured with: Tape Dental Injury: Teeth and Oropharynx as per pre-operative assessment

## 2016-08-09 NOTE — Transfer of Care (Signed)
Immediate Anesthesia Transfer of Care Note  Patient: Daryl Cook [facility]  Procedure(s) Performed: Procedure(s): OPEN REDUCTION INTERNAL FIXATION (ORIF) LEFT PILON AND FIBULA FRACTURE (Left)  Patient Location: PACU  Anesthesia Type:General and GA combined with regional for post-op pain  Level of Consciousness: awake, alert , oriented and patient cooperative  Airway & Oxygen Therapy: Patient Spontanous Breathing and Patient connected to face mask oxygen  Post-op Assessment: Report given to RN and Post -op Vital signs reviewed and stable  Post vital signs: Reviewed and stable  Last Vitals:  Vitals:   08/09/16 0351 08/09/16 0737  BP: 111/79 123/84  Pulse: 79 73  Resp: 10 12  Temp: 36.8 C 36.8 C    Last Pain:  Vitals:   08/09/16 0737  TempSrc: Oral  PainSc: 3       Patients Stated Pain Goal: 3 (12/08/10 1975)  Complications: No apparent anesthesia complications

## 2016-08-09 NOTE — Progress Notes (Signed)
Orthopedic Tech Progress Note Patient Details:  Daryl Cook [facility] 1955-06-09 [id number]  Ortho Devices Type of Ortho Device: CAM walker Ortho Device/Splint Location: lle Ortho Device/Splint Interventions: Application   Daryl Cook 08/09/2016, 3:16 PM
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
## 2016-08-09 NOTE — Anesthesia Preprocedure Evaluation (Addendum)
Anesthesia Evaluation  Patient identified by MRN, date of birth, ID band Patient awake    Reviewed: Allergy & Precautions, H&P , NPO status , Patient's Chart, lab work & pertinent test results  Airway Mallampati: II  TM Distance: >3 FB Neck ROM: Full    Dental no notable dental hx. (+) Teeth Intact, Dental Advisory Given   Pulmonary neg pulmonary ROS, former smoker,    Pulmonary exam normal breath sounds clear to auscultation       Cardiovascular negative cardio ROS   Rhythm:Regular Rate:Normal     Neuro/Psych negative neurological ROS  negative psych ROS   GI/Hepatic GERD  Medicated and Controlled,  Endo/Other  negative endocrine ROS  Renal/GU negative Renal ROS  negative genitourinary   Musculoskeletal   Abdominal   Peds  Hematology negative hematology ROS (+)   Anesthesia Other Findings   Reproductive/Obstetrics negative OB ROS                            Anesthesia Physical Anesthesia Plan  ASA: II  Anesthesia Plan: General   Post-op Pain Management:  Regional for Post-op pain   Induction: Intravenous  PONV Risk Score and Plan: 3 and Ondansetron, Dexamethasone, Propofol and Midazolam  Airway Management Planned: LMA  Additional Equipment:   Intra-op Plan:   Post-operative Plan: Extubation in OR  Informed Consent: I have reviewed the patients History and Physical, chart, labs and discussed the procedure including the risks, benefits and alternatives for the proposed anesthesia with the patient or authorized representative who has indicated his/her understanding and acceptance.   Dental advisory given  Plan Discussed with: CRNA  Anesthesia Plan Comments:        Anesthesia Quick Evaluation

## 2016-08-09 NOTE — Anesthesia Postprocedure Evaluation (Signed)
Anesthesia Post Note  Patient: Daryl Cook [facility]  Procedure(s) Performed: Procedure(s) (LRB): OPEN REDUCTION INTERNAL FIXATION (ORIF) LEFT PILON AND FIBULA FRACTURE (Left)     Patient location during evaluation: PACU Anesthesia Type: General and Regional Level of consciousness: awake and alert Pain management: pain level controlled Vital Signs Assessment: post-procedure vital signs reviewed and stable Respiratory status: spontaneous breathing, nonlabored ventilation, respiratory function stable and patient connected to nasal cannula oxygen Cardiovascular status: blood pressure returned to baseline and stable Postop Assessment: no signs of nausea or vomiting Anesthetic complications: no    Last Vitals:  Vitals:   08/09/16 0737 08/09/16 1100  BP: 123/84 125/86  Pulse: 73 92  Resp: 12 13  Temp: 36.8 C 36.6 C    Last Pain:  Vitals:   08/09/16 1100  TempSrc:   PainSc: 0-No pain                 Torsha Lemus,W. EDMOND
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
## 2016-08-09 NOTE — Progress Notes (Addendum)
Patient denies sensation to  left lower limb. Area is warm to touch, capillary refill brisk and pt is able to move leg but not the toes. Keep limp free of pressure and continue vascular checks.
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
## 2016-08-09 NOTE — Progress Notes (Signed)
Patient C/O left shoulder excruatiang pain- Medicate patient as per order - Review with pt effective pain management as per care planning- Encourage pt to report any discomfort - reposition pt as tolerated.

## 2016-08-09 NOTE — Interval H&P Note (Signed)
History and Physical Interval Note:  08/09/2016 7:19 AM  Daryl Cook  has presented today for surgery, with the diagnosis of LEFT PILON AND FIB FRACTURE  The various methods of treatment have been discussed with the patient and family. After consideration of risks, benefits and other options for treatment, the patient has consented to  Procedure(s): OPEN REDUCTION INTERNAL FIXATION (ORIF) LEFT PILON AND FIBULA FRACTURE (Left) as a surgical intervention .  The patient's history has been reviewed, patient examined, no change in status, stable for surgery.  I have reviewed the patient's chart and labs.  Questions were answered to the patient's satisfaction.     Newt Minion

## 2016-08-09 NOTE — Progress Notes (Signed)
Report called to short stay RN, pt removed glasses and gave to me with cell phone and wedding ring to give to his mother when she arrives. Pt transferred on 2L O2.

## 2016-08-09 NOTE — Progress Notes (Signed)
Wedding band picked up from short stay by Nidia, NS
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
## 2016-08-10 NOTE — Progress Notes (Signed)
     Subjective: 1 Day Post-Op Procedure(s) (LRB): OPEN REDUCTION INTERNAL FIXATION (ORIF) LEFT PILON AND FIBULA FRACTURE (Left) Awake, alert and oriented x 4. VAC left lateral ankle and distal lateral leg intact.Numbness left anterolateral left ankle and foot. Present prior to surgery.  Patient reports pain as mild.    Objective:   VITALS:  Temp:  [97.6 F (36.4 C)-98.7 F (37.1 C)] 97.6 F (36.4 C) (06/10 0824) Pulse Rate:  [67-112] 81 (06/10 0824) Resp:  [6-21] 11 (06/10 0824) BP: (132-148)/(74-89) 132/87 (06/10 0824) SpO2:  [95 %-100 %] 100 % (06/10 0824)  ABD soft Intact pulses distally Incision: dressing C/D/I and scant drainage   LABS  Recent Labs  08/08/16 1627 08/09/16 0020  HGB 14.0 13.9  WBC 6.0 8.1  PLT 204 198    Recent Labs  08/08/16 1627 08/09/16 0020  NA 140 135  K 3.9 4.0  CL 109 104  CO2 26 22  BUN 14 13  CREATININE 1.01 0.93  GLUCOSE 161* 166*    Recent Labs  08/08/16 1627  INR 1.01     Assessment/Plan: 1 Day Post-Op Procedure(s) (LRB): OPEN REDUCTION INTERNAL FIXATION (ORIF) LEFT PILON AND FIBULA FRACTURE (Left)  Advance diet Up with therapy  Left arm with sling, has left rib fractures, left clavicle and left shoulder injuries. Two extremity injuries will limit his ability to be Independent may need temporary SNF.  Jessy Oto 08/10/2016, 12:33 PM Patient ID: Daryl Cook, male   DOB: 1955-06-04, 61 y.o.   MRN: [id number]

## 2016-08-11 ENCOUNTER — Encounter (HOSPITAL_COMMUNITY): Payer: Self-pay | Admitting: Orthopedic Surgery

## 2016-08-11 MED ORDER — PNEUMOCOCCAL VAC POLYVALENT 25 MCG/0.5ML IJ INJ
0.5000 mL | INJECTION | Freq: Once | INTRAMUSCULAR | Status: AC
  Administered 2016-08-11: 0.5 mL via INTRAMUSCULAR
  Filled 2016-08-11: qty 0.5

## 2016-08-11 MED ORDER — OXYCODONE HCL 5 MG PO TABS
10.0000 mg | ORAL_TABLET | ORAL | Status: DC | PRN
  Administered 2016-08-11 – 2016-08-12 (×5): 15 mg via ORAL
  Filled 2016-08-11 (×5): qty 3

## 2016-08-11 MED ORDER — ACETAMINOPHEN 325 MG PO TABS
650.0000 mg | ORAL_TABLET | Freq: Three times a day (TID) | ORAL | Status: DC
  Administered 2016-08-11 – 2016-08-13 (×8): 650 mg via ORAL
  Filled 2016-08-11 (×8): qty 2
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
## 2016-08-11 NOTE — Progress Notes (Signed)
Noted slow progress with PT and OT with  Initial evals recommending Home Health. If pt would like to be considered for a possible inpt rehab admission, please place order for rehab consult.[phone]
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
## 2016-08-11 NOTE — Progress Notes (Signed)
Patient ID: Daryl Cook, male   DOB: 04-01-1955, 61 y.o.   MRN: [id number] Patient asymptomatic with a left ankle he is moving his ankle well wound VAC is functioning well. Patient's main complaint is his left upper quadrant with rib fractures clavicle fracture and scapular fracture. Patient will need discharge to skilled nursing he will be transfer training nonweightbearing left upper extremity and left lower extremity.

## 2016-08-12 DIAGNOSIS — J939 Pneumothorax, unspecified: Secondary | ICD-10-CM

## 2016-08-12 DIAGNOSIS — T1490XA Injury, unspecified, initial encounter: Secondary | ICD-10-CM

## 2016-08-12 DIAGNOSIS — B1920 Unspecified viral hepatitis C without hepatic coma: Secondary | ICD-10-CM

## 2016-08-12 DIAGNOSIS — R739 Hyperglycemia, unspecified: Secondary | ICD-10-CM

## 2016-08-12 DIAGNOSIS — E785 Hyperlipidemia, unspecified: Secondary | ICD-10-CM

## 2016-08-12 DIAGNOSIS — S82302A Unspecified fracture of lower end of left tibia, initial encounter for closed fracture: Secondary | ICD-10-CM

## 2016-08-12 DIAGNOSIS — G8918 Other acute postprocedural pain: Secondary | ICD-10-CM

## 2016-08-12 MED ORDER — OXYCODONE HCL 5 MG PO TABS
10.0000 mg | ORAL_TABLET | ORAL | Status: DC | PRN
  Administered 2016-08-12 – 2016-08-13 (×5): 20 mg via ORAL
  Filled 2016-08-12 (×6): qty 4

## 2016-08-12 MED ORDER — TRAMADOL HCL 50 MG PO TABS
50.0000 mg | ORAL_TABLET | Freq: Four times a day (QID) | ORAL | Status: DC | PRN
  Administered 2016-08-12 – 2016-08-13 (×3): 50 mg via ORAL
  Filled 2016-08-12 (×3): qty 1
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
## 2016-08-12 NOTE — Progress Notes (Signed)
I met with pt to discuss a possible inpt rehab admission. He is concerned for his pain management . I discussed the need to transition off IV pain meds prior to any rehab venue. He prefers inpt rehab admit so I will begin insurance authorization with [place] [employer] [place]. I will follow up tomorrow. [phone]
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
## 2016-08-12 NOTE — Clinical Social Work Note (Signed)
Clinical Social Work Assessment  Patient Details  Name: Daryl Cook MRN: [id number] Date of Birth: [birth year]  Date of referral:  08/12/16               Reason for consult:  Facility Placement, Substance Use/ETOH Abuse, Trauma                Permission sought to share information with:  Family Supports Permission granted to share information::     Name::     Surveyor, quantity::     Relationship::  Spouse  Contact Information:     Housing/Transportation Living arrangements for the past 2 months:  Single Family Home Source of Information:  Patient Patient Interpreter Needed:  None Criminal Activity/Legal Involvement Pertinent to Current Situation/Hospitalization:  No - Comment as needed Significant Relationships:  Spouse Lives with:  Spouse Do you feel safe going back to the place where you live?    Need for family participation in patient care:     Care giving concerns:  No family or friends present at bedside during initial assessment.   Social Worker assessment / plan:  CSW spoke with pt at bedside. CSW was consulted for SBIRT as well as backup SNF placement. At this time CIR will open up pt's case however, if needed backup placement pt prefers [facility] Pt agreeable to CSW sending [facility] referral. Pt denies any substance use. Pt denies any nightmares or flashbacks. Pt accepted acute stress response packet. CSW will continue to follow to determine if SNF placement will be needed.   Employment status:  Retired Nurse, adult PT Recommendations:  Inpatient [facility] / Referral to community resources:  [place], SBIRT  Patient/Family's Response to care:  Pt verbalized understanding of CSW role and expressed appreciation for support. Pt denies any concern regarding pt care at this time.   Patient/Family's Understanding of and Emotional Response to Diagnosis, Current Treatment, and Prognosis:  Pt understanding  and realistic regarding physical limitations. Pt understands the need for SNF placement at d/c. Pt agreeable to SNF placement at d/c, at this time. Pt's responses emotionally appropriate during conversation with CSW. Pt denies any concern regarding treatment plan at this time. CSW will continue to provide support and facilitate d/c needs.   Emotional Assessment Appearance:  Appears stated age Attitude/Demeanor/Rapport:   (Patient was appropriate.) Affect (typically observed):  Accepting, Calm, Appropriate Orientation:  Oriented to Situation, Oriented to  Time, Oriented to Place, Oriented to Self Alcohol / Substance use:  Not Applicable Psych involvement (Current and /or in the community):  No (Comment)  Discharge Needs  Concerns to be addressed:  Care Coordination Readmission within the last 30 days:  No Current discharge risk:  Dependent with Mobility Barriers to Discharge:  Continued Medical Work up   [employer], LCSW 08/12/2016, 12:59 PM

## 2016-08-12 NOTE — NC FL2 (Signed)
[place] LEVEL OF CARE SCREENING TOOL     IDENTIFICATION  Patient Name: Daryl Cook Birthdate: 09-17-1955 Sex: male Admission Date (Current Location): 08/08/2016  [facility] [place] Number:  Herbalist and Address:  [facility], [place] [address] [place], [place] [address]      Provider Number: [id number]  Attending Physician Name and Address:  Md, Trauma, MD  Relative Name and Phone Number:       Current Level of Care: Hospital Recommended Level of Care: [place] Prior Approval Number:    Date Approved/Denied:   PASRR Number: [id number]  Discharge Plan: SNF    Current Diagnoses: Patient Active Problem List   Diagnosis Date Noted  . Closed fracture of left distal tibia   . Pneumothorax, left   . Hepatitis C virus infection without hepatic coma   . Hyperglycemia   . Hyperlipidemia   . Post-operative pain   . Trauma   . Multiple fractures of ribs, left side, initial encounter for closed fracture 08/08/2016  . Pilon fracture of left tibia, closed, initial encounter   . Seborrheic dermatitis 11/23/2013  . Routine general medical examination at a health care facility 12/25/2011  . ACTINIC KERATOSIS 06/14/2009  . ERECTILE DYSFUNCTION, ORGANIC 12/04/2008  . GERD 09/08/2007  . Chronic hepatitis C ([place]) 01/22/2007    Orientation RESPIRATION BLADDER Height & Weight     Self, Place, Situation, Time  Normal Continent Weight: 170 lb 10.2 oz (77.4 kg) Height:  5\' 8"  (172.7 cm)  BEHAVIORAL SYMPTOMS/MOOD NEUROLOGICAL BOWEL NUTRITION STATUS      Continent  (Please see d/c summary)  AMBULATORY STATUS COMMUNICATION OF NEEDS Skin   Limited Assist Verbally Wound Vac, Surgical wounds (Left leg, Prevena wound vac, continous 100 mmHg)                       Personal Care Assistance Level of Assistance  Bathing, Feeding, Dressing Bathing Assistance: Limited assistance Feeding assistance:  Independent Dressing Assistance: Limited assistance     Functional Limitations Info  Sight, Hearing, Speech Sight Info: Adequate Hearing Info: Adequate Speech Info: Adequate    SPECIAL CARE FACTORS FREQUENCY  PT (By licensed PT), OT (By licensed OT)     PT Frequency: 6x week OT Frequency: 6x week            Contractures Contractures Info: Not present    Additional Factors Info  Code Status, Allergies Code Status Info: Full Code Allergies Info: No allergies on file           Current Medications (08/12/2016):  This is the current hospital active medication list Current Facility-Administered Medications  Medication Dose Route Frequency Provider Last Rate Last Dose  . 0.9 %  sodium chloride infusion   Intravenous Continuous Newt Minion, MD   Stopped at 08/10/16 1231  . acetaminophen (TYLENOL) tablet 650 mg  650 mg Oral TID Rayburn, Kelly A, PA-C   650 mg at 08/12/16 0920  . bisacodyl (DULCOLAX) suppository 10 mg  10 mg Rectal Daily PRN Newt Minion, MD      . dextrose 5 % and 0.45 % NaCl with KCl 20 mEq/L infusion   Intravenous Continuous Georganna Skeans, MD   Stopped at 08/09/16 [phone]  . docusate sodium (COLACE) capsule 100 mg  100 mg Oral BID Newt Minion, MD   100 mg at 08/12/16 0920  . enoxaparin (LOVENOX) injection 30 mg  30 mg Subcutaneous  Q12H Clovis Riley, MD   30 mg at 08/12/16 0920  . HYDROmorphone (DILAUDID) injection 1 mg  1 mg Intravenous Q2H PRN Newt Minion, MD   1 mg at 08/11/16 1610  . HYDROmorphone (DILAUDID) injection 2 mg  2 mg Intravenous Q3H PRN Georganna Skeans, MD   2 mg at 08/12/16 1029  . ipratropium-albuterol (DUONEB) 0.5-2.5 (3) MG/3ML nebulizer solution 3 mL  3 mL Nebulization Q6H PRN Georganna Skeans, MD      . lactated ringers infusion   Intravenous Continuous Roderic Palau, MD   Stopped at 08/10/16 1900  . magnesium citrate solution 1 Bottle  1 Bottle Oral Once PRN Newt Minion, MD      . MEDLINE mouth rinse  15 mL Mouth  Rinse BID Georganna Skeans, MD   15 mL at 08/10/16 2143  . methocarbamol (ROBAXIN) tablet 500 mg  500 mg Oral Q6H PRN Newt Minion, MD   500 mg at 08/10/16 1934   Or  . methocarbamol (ROBAXIN) 500 mg in dextrose 5 % 50 mL IVPB  500 mg Intravenous Q6H PRN Newt Minion, MD   Stopped at 08/10/16 1036  . metoCLOPramide (REGLAN) tablet 5-10 mg  5-10 mg Oral Q8H PRN Newt Minion, MD       Or  . metoCLOPramide (REGLAN) injection 5-10 mg  5-10 mg Intravenous Q8H PRN Newt Minion, MD   10 mg at 08/10/16 1250  . ondansetron (ZOFRAN) tablet 4 mg  4 mg Oral Q6H PRN Newt Minion, MD       Or  . ondansetron [facility]) injection 4 mg  4 mg Intravenous Q6H PRN Newt Minion, MD      . oxyCODONE (Oxy IR/ROXICODONE) immediate release tablet 10-20 mg  10-20 mg Oral Q4H PRN Rayburn, Kelly A, PA-C      . pantoprazole (PROTONIX) EC tablet 40 mg  40 mg Oral Daily Georganna Skeans, MD   40 mg at 08/12/16 0920  . polyethylene glycol (MIRALAX / GLYCOLAX) packet 17 g  17 g Oral Daily PRN Newt Minion, MD   17 g at 08/12/16 1030  . traMADol (ULTRAM) tablet 50 mg  50 mg Oral Q6H PRN Rayburn, Kelly A, PA-C   50 mg at 08/12/16 1029     Discharge Medications: Please see discharge summary for a list of discharge medications.  Relevant Imaging Results:  Relevant Lab Results:   Additional Information SSN: [id number]  Eileen Stanford, LCSW

## 2016-08-13 ENCOUNTER — Inpatient Hospital Stay (HOSPITAL_COMMUNITY)
Admission: RE | Admit: 2016-08-13 | Discharge: 2016-08-18 | DRG: 561 | Disposition: A | Discharge status: Home Health | Payer: BLUE CROSS/BLUE SHIELD | Source: Intra-hospital | Attending: Physical Medicine & Rehabilitation | Admitting: Physical Medicine & Rehabilitation

## 2016-08-13 DIAGNOSIS — Z87891 Personal history of nicotine dependence: Secondary | ICD-10-CM | POA: Diagnosis not present

## 2016-08-13 DIAGNOSIS — B1920 Unspecified viral hepatitis C without hepatic coma: Secondary | ICD-10-CM | POA: Diagnosis not present

## 2016-08-13 DIAGNOSIS — S42009D Fracture of unspecified part of unspecified clavicle, subsequent encounter for fracture with routine healing: Secondary | ICD-10-CM

## 2016-08-13 DIAGNOSIS — K219 Gastro-esophageal reflux disease without esophagitis: Secondary | ICD-10-CM | POA: Diagnosis present

## 2016-08-13 DIAGNOSIS — S82402S Unspecified fracture of shaft of left fibula, sequela: Secondary | ICD-10-CM | POA: Diagnosis not present

## 2016-08-13 DIAGNOSIS — S27321D Contusion of lung, unilateral, subsequent encounter: Secondary | ICD-10-CM

## 2016-08-13 DIAGNOSIS — S2242XD Multiple fractures of ribs, left side, subsequent encounter for fracture with routine healing: Secondary | ICD-10-CM

## 2016-08-13 DIAGNOSIS — B182 Chronic viral hepatitis C: Secondary | ICD-10-CM | POA: Diagnosis present

## 2016-08-13 DIAGNOSIS — S2242XA Multiple fractures of ribs, left side, initial encounter for closed fracture: Secondary | ICD-10-CM

## 2016-08-13 DIAGNOSIS — I10 Essential (primary) hypertension: Secondary | ICD-10-CM

## 2016-08-13 DIAGNOSIS — S82832S Other fracture of upper and lower end of left fibula, sequela: Secondary | ICD-10-CM

## 2016-08-13 DIAGNOSIS — K5900 Constipation, unspecified: Secondary | ICD-10-CM | POA: Diagnosis not present

## 2016-08-13 DIAGNOSIS — K5903 Drug induced constipation: Secondary | ICD-10-CM

## 2016-08-13 DIAGNOSIS — M7989 Other specified soft tissue disorders: Secondary | ICD-10-CM | POA: Diagnosis not present

## 2016-08-13 DIAGNOSIS — D62 Acute posthemorrhagic anemia: Secondary | ICD-10-CM

## 2016-08-13 DIAGNOSIS — S82872D Displaced pilon fracture of left tibia, subsequent encounter for closed fracture with routine healing: Secondary | ICD-10-CM | POA: Diagnosis not present

## 2016-08-13 DIAGNOSIS — Z79899 Other long term (current) drug therapy: Secondary | ICD-10-CM

## 2016-08-13 DIAGNOSIS — S42102D Fracture of unspecified part of scapula, left shoulder, subsequent encounter for fracture with routine healing: Secondary | ICD-10-CM

## 2016-08-13 DIAGNOSIS — S82402A Unspecified fracture of shaft of left fibula, initial encounter for closed fracture: Secondary | ICD-10-CM | POA: Diagnosis not present

## 2016-08-13 DIAGNOSIS — G8918 Other acute postprocedural pain: Secondary | ICD-10-CM | POA: Diagnosis not present

## 2016-08-13 DIAGNOSIS — S82832D Other fracture of upper and lower end of left fibula, subsequent encounter for closed fracture with routine healing: Secondary | ICD-10-CM | POA: Diagnosis present

## 2016-08-13 DIAGNOSIS — S82402D Unspecified fracture of shaft of left fibula, subsequent encounter for closed fracture with routine healing: Secondary | ICD-10-CM | POA: Diagnosis not present

## 2016-08-13 DIAGNOSIS — S82302S Unspecified fracture of lower end of left tibia, sequela: Secondary | ICD-10-CM

## 2016-08-13 LAB — CBC
HCT: 36.9 % — ABNORMAL LOW (ref 39.0–52.0)
HEMOGLOBIN: 12.2 g/dL — AB (ref 13.0–17.0)
MCH: 30.7 pg (ref 26.0–34.0)
MCHC: 33.1 g/dL (ref 30.0–36.0)
MCV: 92.7 fL (ref 78.0–100.0)
Platelets: 217 10*3/uL (ref 150–400)
RBC: 3.98 MIL/uL — ABNORMAL LOW (ref 4.22–5.81)
RDW: 13 % (ref 11.5–15.5)
WBC: 5.7 10*3/uL (ref 4.0–10.5)

## 2016-08-13 LAB — CREATININE, SERUM
CREATININE: 0.8 mg/dL (ref 0.61–1.24)
GFR calc Af Amer: >60 mL/min (ref >60)
GFR calc non Af Amer: >60 mL/min (ref >60)

## 2016-08-13 MED ORDER — POLYETHYLENE GLYCOL 3350 17 G PO PACK
17.0000 g | PACK | Freq: Every day | ORAL | Status: DC
  Filled 2016-08-13 (×4): qty 1

## 2016-08-13 MED ORDER — METHOCARBAMOL 500 MG PO TABS
500.0000 mg | ORAL_TABLET | Freq: Four times a day (QID) | ORAL | Status: DC | PRN
  Administered 2016-08-14 – 2016-08-18 (×6): 500 mg via ORAL
  Filled 2016-08-13 (×7): qty 1

## 2016-08-13 MED ORDER — ONDANSETRON HCL 4 MG PO TABS: 4.0000 mg | ORAL_TABLET | Freq: Four times a day (QID) | ORAL | Status: DC | PRN

## 2016-08-13 MED ORDER — METHOCARBAMOL 1000 MG/10ML IJ SOLN
500.0000 mg | Freq: Four times a day (QID) | INTRAVENOUS | Status: DC | PRN
  Filled 2016-08-13: qty 5

## 2016-08-13 MED ORDER — TRAMADOL HCL 50 MG PO TABS
50.0000 mg | ORAL_TABLET | Freq: Four times a day (QID) | ORAL | Status: DC | PRN
Start: 2016-08-13 — End: 2016-08-18
  Administered 2016-08-13 – 2016-08-17 (×5): 50 mg via ORAL
  Filled 2016-08-13 (×7): qty 1

## 2016-08-13 MED ORDER — DOCUSATE SODIUM 100 MG PO CAPS
100.0000 mg | ORAL_CAPSULE | Freq: Two times a day (BID) | ORAL | Status: DC
  Administered 2016-08-13 – 2016-08-18 (×6): 100 mg via ORAL
  Filled 2016-08-13 (×9): qty 1

## 2016-08-13 MED ORDER — OXYCODONE HCL 5 MG PO TABS
10.0000 mg | ORAL_TABLET | ORAL | Status: DC | PRN
  Administered 2016-08-13 – 2016-08-18 (×27): 20 mg via ORAL
  Filled 2016-08-13 (×27): qty 4

## 2016-08-13 MED ORDER — ONDANSETRON HCL 4 MG/2ML IJ SOLN: 4.0000 mg | Freq: Four times a day (QID) | INTRAMUSCULAR | Status: DC | PRN

## 2016-08-13 MED ORDER — ENOXAPARIN SODIUM 30 MG/0.3ML ~~LOC~~ SOLN: 30.0000 mg | Freq: Two times a day (BID) | SUBCUTANEOUS | Status: DC

## 2016-08-13 MED ORDER — SORBITOL 70 % SOLN: 30.0000 mL | Freq: Every day | Status: DC | PRN

## 2016-08-13 MED ORDER — POLYETHYLENE GLYCOL 3350 17 G PO PACK: 17.0000 g | PACK | Freq: Every day | ORAL | Status: DC

## 2016-08-13 MED ORDER — ENOXAPARIN SODIUM 30 MG/0.3ML ~~LOC~~ SOLN
30.0000 mg | Freq: Two times a day (BID) | SUBCUTANEOUS | Status: DC
  Administered 2016-08-13 – 2016-08-18 (×10): 30 mg via SUBCUTANEOUS
  Filled 2016-08-13 (×10): qty 0.3

## 2016-08-13 MED ORDER — IPRATROPIUM-ALBUTEROL 0.5-2.5 (3) MG/3ML IN SOLN: 3.0000 mL | Freq: Four times a day (QID) | RESPIRATORY_TRACT | Status: DC | PRN

## 2016-08-13 MED ORDER — PANTOPRAZOLE SODIUM 40 MG PO TBEC
40.0000 mg | DELAYED_RELEASE_TABLET | Freq: Every day | ORAL | Status: DC
  Administered 2016-08-14 – 2016-08-18 (×5): 40 mg via ORAL
  Filled 2016-08-13 (×5): qty 1

## 2016-08-13 MED ORDER — BISACODYL 10 MG RE SUPP: 10.0000 mg | Freq: Every day | RECTAL | Status: DC | PRN

## 2016-08-13 NOTE — Discharge Instructions (Signed)
Rib Fracture ° °A rib fracture is a break or crack in one of the bones of the ribs. The ribs are a group of long, curved bones that wrap around your chest and attach to your spine. They protect your lungs and other organs in the chest cavity. A broken or cracked rib is often painful, but most do not cause other problems. Most rib fractures heal on their own over time. However, rib fractures can be more serious if multiple ribs are broken or if broken ribs move out of place and push against other structures. °What are the causes? °· A direct blow to the chest. For example, this could happen during contact sports, a car accident, or a fall against a hard object. °· Repetitive movements with high force, such as pitching a baseball or having severe coughing spells. °What are the signs or symptoms? °· Pain when you breathe in or cough. °· Pain when someone presses on the injured area. °How is this diagnosed? °Your caregiver will perform a physical exam. Various imaging tests may be ordered to confirm the diagnosis and to look for related injuries. These tests may include a chest X-ray, computed tomography (CT), magnetic resonance imaging (MRI), or a bone scan. °How is this treated? °Rib fractures usually heal on their own in 1-3 months. The longer healing period is often associated with a continued cough or other aggravating activities. During the healing period, pain control is very important. Medication is usually given to control pain. Hospitalization or surgery may be needed for more severe injuries, such as those in which multiple ribs are broken or the ribs have moved out of place. °Follow these instructions at home: °· Avoid strenuous activity and any activities or movements that cause pain. Be careful during activities and avoid bumping the injured rib. °· Gradually increase activity as directed by your caregiver. °· Only take over-the-counter or prescription medications as directed by your caregiver. Do not take  other medications without asking your caregiver first. °· Apply ice to the injured area for the first 1-2 days after you have been treated or as directed by your caregiver. Applying ice helps to reduce inflammation and pain. °? Put ice in a plastic bag. °? Place a towel between your skin and the bag. °? Leave the ice on for 15-20 minutes at a time, every 2 hours while you are awake. °· Perform deep breathing as directed by your caregiver. This will help prevent pneumonia, which is a common complication of a broken rib. Your caregiver may instruct you to: °? Take deep breaths several times a day. °? Try to cough several times a day, holding a pillow against the injured area. °? Use a device called an incentive spirometer to practice deep breathing several times a day. °· Drink enough fluids to keep your urine clear or pale yellow. This will help you avoid constipation. °· Do not wear a rib belt or binder. These restrict breathing, which can lead to pneumonia. °Get help right away if: °· You have a fever. °· You have difficulty breathing or shortness of breath. °· You develop a continual cough, or you cough up thick or bloody sputum. °· You feel sick to your stomach (nausea), throw up (vomit), or have abdominal pain. °· You have worsening pain not controlled with medications. °This information is not intended to replace advice given to you by your health care provider. Make sure you discuss any questions you have with your health care provider. °Document Released: 02/17/2005   Document Revised: 07/26/2015 Document Reviewed: 04/21/2012 °Elsevier Interactive Patient Education © 2018 [employer] ° °
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
## 2016-08-13 NOTE — Progress Notes (Signed)
Discharged to rehab to [place] room 3 report given to [employer] VSS Pain 7/8 Dilaudid IV given and patient started Magnesium Citrate for constipation. Lungs clear
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
## 2016-08-13 NOTE — Progress Notes (Signed)
Orthopedic Tech Progress Note Patient Details:  Daryl Cook [facility] May 31, 1955 [id number]  Ortho Devices Type of Ortho Device: Sling immobilizer Ortho Device/Splint Location: lue Ortho Device/Splint Interventions: Ordered, Application, Adjustment   Daryl Cook 08/13/2016, 9:37 PM

## 2016-08-13 NOTE — Progress Notes (Signed)
1730 08/13/16 nursing To rehab unit alert and oriented patient with wound vac on left leg. Oriented to unit set up. Pain med given. Orders released. Reported to incoming RN
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
## 2016-08-13 NOTE — Clinical Social Work Note (Signed)
Pt going to CIR today. Clinical Social Worker will sign off for now as social work intervention is no longer needed. Please consult [place] again if new need arises.   Shelton Silvas A Beatric Fulop 08/13/2016
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
## 2016-08-14 ENCOUNTER — Inpatient Hospital Stay (HOSPITAL_COMMUNITY): Payer: BLUE CROSS/BLUE SHIELD | Admitting: Physical Therapy

## 2016-08-14 ENCOUNTER — Ambulatory Visit (HOSPITAL_COMMUNITY): Payer: BLUE CROSS/BLUE SHIELD | Attending: Physician Assistant

## 2016-08-14 ENCOUNTER — Inpatient Hospital Stay (HOSPITAL_COMMUNITY): Payer: BLUE CROSS/BLUE SHIELD | Admitting: Occupational Therapy

## 2016-08-14 ENCOUNTER — Telehealth (INDEPENDENT_AMBULATORY_CARE_PROVIDER_SITE_OTHER): Payer: Self-pay | Admitting: Orthopedic Surgery

## 2016-08-14 ENCOUNTER — Encounter (HOSPITAL_COMMUNITY): Payer: Self-pay

## 2016-08-14 DIAGNOSIS — S82832S Other fracture of upper and lower end of left fibula, sequela: Secondary | ICD-10-CM

## 2016-08-14 DIAGNOSIS — M7989 Other specified soft tissue disorders: Secondary | ICD-10-CM | POA: Diagnosis not present

## 2016-08-14 DIAGNOSIS — Z029 Encounter for administrative examinations, unspecified: Secondary | ICD-10-CM | POA: Insufficient documentation

## 2016-08-14 DIAGNOSIS — B1920 Unspecified viral hepatitis C without hepatic coma: Secondary | ICD-10-CM

## 2016-08-14 DIAGNOSIS — G8918 Other acute postprocedural pain: Secondary | ICD-10-CM

## 2016-08-14 DIAGNOSIS — I10 Essential (primary) hypertension: Secondary | ICD-10-CM

## 2016-08-14 DIAGNOSIS — D62 Acute posthemorrhagic anemia: Secondary | ICD-10-CM

## 2016-08-14 LAB — COMPREHENSIVE METABOLIC PANEL
ALK PHOS: 37 U/L — AB (ref 38–126)
ALT: 14 U/L — AB (ref 17–63)
AST: 20 U/L (ref 15–41)
Albumin: 3.3 g/dL — ABNORMAL LOW (ref 3.5–5.0)
Anion gap: 7 (ref 5–15)
BILIRUBIN TOTAL: 1 mg/dL (ref 0.3–1.2)
BUN: 12 mg/dL (ref 6–20)
CALCIUM: 9 mg/dL (ref 8.9–10.3)
CO2: 28 mmol/L (ref 22–32)
CREATININE: 0.96 mg/dL (ref 0.61–1.24)
Chloride: 101 mmol/L (ref 101–111)
GFR calc Af Amer: >60 mL/min (ref >60)
Glucose, Bld: 99 mg/dL (ref 65–99)
Potassium: 4 mmol/L (ref 3.5–5.1)
Sodium: 136 mmol/L (ref 135–145)
TOTAL PROTEIN: 6.6 g/dL (ref 6.5–8.1)

## 2016-08-14 LAB — CBC WITH DIFFERENTIAL/PLATELET
BASOS ABS: 0 10*3/uL (ref 0.0–0.1)
Basophils Relative: 0 %
Eosinophils Absolute: 0.3 10*3/uL (ref 0.0–0.7)
Eosinophils Relative: 4 %
HEMATOCRIT: 36.4 % — AB (ref 39.0–52.0)
Hemoglobin: 11.9 g/dL — ABNORMAL LOW (ref 13.0–17.0)
LYMPHS PCT: 18 %
Lymphs Abs: 1.1 10*3/uL (ref 0.7–4.0)
MCH: 30.7 pg (ref 26.0–34.0)
MCHC: 32.7 g/dL (ref 30.0–36.0)
MCV: 94.1 fL (ref 78.0–100.0)
MONO ABS: 0.9 10*3/uL (ref 0.1–1.0)
Monocytes Relative: 14 %
NEUTROS ABS: 4.1 10*3/uL (ref 1.7–7.7)
Neutrophils Relative %: 64 %
Platelets: 222 10*3/uL (ref 150–400)
RBC: 3.87 MIL/uL — AB (ref 4.22–5.81)
RDW: 13 % (ref 11.5–15.5)
WBC: 6.4 10*3/uL (ref 4.0–10.5)

## 2016-08-14 NOTE — Progress Notes (Signed)
*  PRELIMINARY RESULTS* Vascular Ultrasound Bilateral lower extremity venous duplex has been completed.  Preliminary findings: No evidence of deep vein thrombosis in the visualized veins, left calf veins not imaged due to wound vac and boot.  Negative for baker's cysts bilaterally.   Daryl Cook 08/14/2016, 9:53 AM
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
## 2016-08-14 NOTE — Progress Notes (Signed)
Patient information reviewed and entered into eRehab system by Marigrace Mccole, RN, CRRN, PPS Coordinator.  Information including medical coding and functional independence measure will be reviewed and updated through discharge.    

## 2016-08-14 NOTE — Telephone Encounter (Signed)
Dan-Pa from [facility] called wanting to know when can the WD vac be removed. Patient maybe discharging on Monday and want to know if Dr Sharol Given can stop by to see patient. Patient is in Rm [phone]
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
## 2016-08-14 NOTE — Telephone Encounter (Signed)
U call and let them know I discontinued the wound VAC today I will see the patient tomorrow morning
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
## 2016-08-14 NOTE — Progress Notes (Signed)
Occupational Therapy Session Note  Patient Details  Name: Daryl Cook MRN: [id number] Date of Birth: [birth year]  Today's Date: 08/14/2016 OT Individual Time: 4174-0814 OT Individual Time Calculation (min): 47 min    Short Term Goals: Week 1:  OT Short Term Goal 1 (Week 1): STGs equal to LTGs set at modified independent level based on ELOS.   Skilled Therapeutic Interventions/Progress Updates:  Pt transferred from bedside recliner to wheelchair with min assist squat pivot, secondary to the foot rest of the recliner not going down.  Had pt donn shoe on the left foot, which he was able to complete with setup only.  This assisted him with wheelchair mobility and steering.  He propelled himself down to the therapy apartment where he practiced simulated walk-in shower transfers using the wheelchair and tub bench, squat pivot.  He was able to complete with min guard assist.  Pt reports that if wheelchair will not fit in the bathroom door he can use the other guest shower/tub as well.  Practiced wheelchair to mat and wheelchair to bed transfers squat pivot to conclude session.  Pt overall only needing min instructional cueing for wheelchair setup and for transfers to the right and left squat pivot adhering to weightbearing precautions.     Therapy Documentation Precautions:  Precautions Precautions: Fall Precaution Comments: VAC to LLE Required Braces or Orthoses: Other Brace/Splint Other Brace/Splint: Cam boot LLE, Lt shoulder sling Restrictions Weight Bearing Restrictions: Yes LUE Weight Bearing: Non weight bearing LLE Weight Bearing: Non weight bearing  Pain: Pain Assessment Pain Assessment: Faces Faces Pain Scale: Hurts even more Pain Type: Acute pain Pain Location: Shoulder Pain Orientation: Left Pain Descriptors / Indicators: Discomfort Pain Onset: With Activity Pain Intervention(s): Repositioned ADL: See Function Navigator for Current Functional  Status.   Therapy/Group: Individual Therapy  Amyria Komar OTR/L 08/14/2016, 4:17 PM

## 2016-08-15 ENCOUNTER — Inpatient Hospital Stay (HOSPITAL_COMMUNITY): Payer: BLUE CROSS/BLUE SHIELD | Admitting: Physical Therapy

## 2016-08-15 ENCOUNTER — Inpatient Hospital Stay (HOSPITAL_COMMUNITY): Payer: BLUE CROSS/BLUE SHIELD | Admitting: Occupational Therapy

## 2016-08-15 DIAGNOSIS — S82402A Unspecified fracture of shaft of left fibula, initial encounter for closed fracture: Secondary | ICD-10-CM

## 2016-08-15 NOTE — Progress Notes (Signed)
Physical Therapy Session Note  Patient Details  Name: Daryl Cook MRN: [id number] Date of Birth: Oct 13, 1955  Today's Date: 08/15/2016 PT Individual Time: 1100-1155 PT Individual Time Calculation (min): 55 min   Short Term Goals: Week 1:  PT Short Term Goal 1 (Week 1): STG=LTG due to ELOS  Skilled Therapeutic Interventions/Progress Updates: Pt presented in bed agreeable to therapy. Performed supine to sit with mod I with additional time due to guarding of L shoulder. PTA donned shoulder and pt prformed stand pivot to w/c with min cues for NWB as foot was placed on floor during transfer. Pt worked on w/c propulsion throughout hospital inside/outside for endurance.  Pt required occasional cues for negotiation of smaller spaces and encouraging rest breaks when needed. Pt returned to room and discussed setting up family edu with wife over weekend for instruction on bumping w/c up/down step. Pt remained in w/c at end of session with call bell within reach and all needs met.      Therapy Documentation Precautions:  Precautions Precautions: Fall Precaution Comments:   Required Braces or Orthoses: Other Brace/Splint Other Brace/Splint: Cam boot LLE, Lt shoulder sling Restrictions Weight Bearing Restrictions: Yes LUE Weight Bearing: Non weight bearing LLE Weight Bearing: Non weight bearing General:   Vital Signs: Therapy Vitals Temp: 98.4 F (36.9 C) Temp Source: Oral Pulse Rate: 99 Resp: 20 BP: 133/72 Patient Position (if appropriate): Sitting Oxygen Therapy SpO2: 94 % O2 Device: Not Delivered Pain: Pain Assessment Pain Assessment: 0-10 Faces Pain Scale: Hurts worst Pain Type: Acute pain Pain Location: Shoulder Pain Orientation: Left Pain Descriptors / Indicators: Discomfort;Stabbing Pain Onset: On-going Pain Intervention(s): Repositioned;Emotional support;RN made aware   See Function Navigator for Current Functional Status.   Therapy/Group: Individual  Therapy  Ad Guttman  Hayle Parisi, PTA  08/15/2016, 4:14 PM
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
## 2016-08-15 NOTE — Progress Notes (Signed)
Patient ID: Daryl Cook, male   DOB: 10/22/1955, 61 y.o.   MRN: [id number] Patient's wound is evaluated this morning. It has slightly gaped open secondary to swelling. At discharge patient will wash his leg with soap and water, and applied 4 x 4 gauze plus the Ace wrap and continue elevation. continue nonweightbearing left lower extremity. Patient was given instructions for range of motion of his ankle and range of motion of his left shoulder. Plan nonweightbearing left upper extremity as well.
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
## 2016-08-15 NOTE — Care Management Note (Signed)
[facility] Individual Statement of Services  Patient Name:  Daryl Cook [facility]  Date:  08/15/2016  Welcome to the [facility].  Our goal is to provide you with an individualized program based on your diagnosis and situation, designed to meet your specific needs.  With this comprehensive rehabilitation program, you will be expected to participate in at least 3 hours of rehabilitation therapies Monday-Friday, with modified therapy programming on the weekends.  Your rehabilitation program will include the following services:  Physical Therapy (PT), Occupational Therapy (OT), 24 hour per day rehabilitation nursing, Case Management (Social Worker), Rehabilitation Medicine, Nutrition Services and Pharmacy Services  Weekly team conferences will be held on Wednesdays to discuss your progress.  Your Social Worker will talk with you frequently to get your input and to update you on team discussions.  Team conferences with you and your family in attendance may also be held.  Expected length of stay: 5-7 days  Overall anticipated outcome: mod ind w/c level  Depending on your progress and recovery, your program may change. Your Social Worker will coordinate services and will keep you informed of any changes. Your Social Worker's name and contact numbers are listed  below.  The following services may also be recommended but are not provided by the [facility] will be made to provide these services after discharge if needed.  Arrangements include referral to agencies that provide these services.  Your insurance has been verified to be:  [employer] [place] Your primary doctor is:  Dr. Silvio Pate  Pertinent information will be shared with your doctor and your insurance company.  Social Worker:  [place], [place] or ([phone]    Information discussed with and copy given to patient by: Lennart Pall, 08/15/2016, 2:14 PM

## 2016-08-15 NOTE — Progress Notes (Signed)
Occupational Therapy Session Note  Patient Details  Name: Daryl Cook MRN: [id number] Date of Birth: 17-Oct-1955  Today's Date: 08/15/2016 OT Individual Time: 0904-1000 OT Individual Time Calculation (min): 56 min    Short Term Goals: Week 1:  OT Short Term Goal 1 (Week 1): STGs equal to LTGs set at modified independent level based on ELOS.   Skilled Therapeutic Interventions/Progress Updates:    Pt completed transfer from supine to sit EOB with supervision.  Mod assist for donning sling secondary to shoulder pain.  He completed transfers to the wheelchair and to to the tub bench with min guard assist secondary to setup and size limitations in the shower.  Increased pain in the left shoulder throughout shower secondary to not having it supported.  Unable to use the LUE this session for washing the right arm, even with use of a LH sponge.  Educated pt on one handed technique for washing the right underarm however.  Pt transferred out to the wheelchair for grooming and dressing.  Min instructional cueing for sequencing LB dressing tasks, to start with the harder side first.  Min assist for donning cam boot as well.  Pt transferred back to bed at end of session with supervision.  Provided ice pack for comfort on the left shoulder.  Nursing in to administer medications as well.    Therapy Documentation Precautions:  Precautions Precautions: Fall Precaution Comments:   Required Braces or Orthoses: Other Brace/Splint Other Brace/Splint: Cam boot LLE, Lt shoulder sling Restrictions Weight Bearing Restrictions: Yes LUE Weight Bearing: Non weight bearing LLE Weight Bearing: Non weight bearing  Pain: Pain Assessment Pain Assessment: 0-10 Pain Score: 5  Faces Pain Scale: Hurts worst Pain Type: Acute pain Pain Location: Shoulder Pain Orientation: Left Pain Descriptors / Indicators: Discomfort;Stabbing Pain Onset: On-going Patients Stated Pain Goal: 2 Pain Intervention(s):  Repositioned;Emotional support;RN made aware 2nd Pain Site Pain Score: 5 Pain Type: Acute pain Pain Location: Ankle Pain Orientation: Left Pain Onset: Gradual Patient's Stated Pain Goal: 2 Pain Intervention(s): Medication (See eMAR) ADL:  See Function Navigator for Current Functional Status.   Therapy/Group: Individual Therapy  Zayaan Kozak OTR/L 08/15/2016, 12:20 PM

## 2016-08-15 NOTE — Discharge Instructions (Signed)
Inpatient Rehab Discharge Instructions  [facility] Discharge date and time: No discharge date for patient encounter.   Activities/Precautions/ Functional Status: Activity: Nonweightbearing left upper extremity and left lower extremity. Shoulder immobilizer at all times. Diet: regular diet Wound Care: keep wound clean and dry Functional status:  ___ No restrictions     ___ Walk up steps independently ___ 24/7 supervision/assistance   ___ Walk up steps with assistance ___ Intermittent supervision/assistance  ___ Bathe/dress independently ___ Walk with walker     _x__ Bathe/dress with assistance ___ Walk Independently    ___ Shower independently ___ Walk with assistance    ___ Shower with assistance ___ No alcohol     ___ Return to work/school ________    COMMUNITY REFERRALS UPON DISCHARGE:    Home Health:   PT       RN                    Agency:  [facility] Phone: [phone]   Medical Equipment/Items Ordered: wheelchair, cushion, bedside commode and tub bench                                                     Agency/Supplier:  [facility] @ [phone]      Special Instructions: No driving   My questions have been answered and I understand these instructions. I will adhere to these goals and the provided educational materials after my discharge from the hospital.  Patient/Caregiver Signature _______________________________ Date __________  Clinician Signature _______________________________________ Date __________  Please bring this form and your medication list with you to all your follow-up doctor's appointments.

## 2016-08-15 NOTE — Progress Notes (Signed)
Occupational Therapy Session Note  Patient Details  Name: Daryl Cook MRN: [id number] Date of Birth: 1956-01-03  Today's Date: 08/15/2016 OT Individual Time: 1416-1530 OT Individual Time Calculation (min): 74 min    Short Term Goals: Week 1:  OT Short Term Goal 1 (Week 1): STGs equal to LTGs set at modified independent level based on ELOS.   Skilled Therapeutic Interventions/Progress Updates:    Pt completed wheelchair mobility to the ADL apartment, the dayroom and around the unit with modified independence.  He was able to complete transfers to the elevated bed in the apartment as well as the couch in the dayroom with supervision following weightbearing precautions.  Had him work on donning his cam boot, which he completed with distant supervision, approaching modified independent level.  Daryl Cook was able to roll himself around in the kitchen to place items back in the refrigerator and cabinets with supervision and min instructional cueing for locking wheelchair brakes.  His wife was in at end of session.  Educated her on donning large pullover shirt, beginning with the LUE first, then the right, and over the head last.  Also educated her on donning sling as well as proper positioning.  Pt still demonstrates increased pain in the left shoulder with only minimal pain in the LLE.  Pt left in wheelchair per request at end of session with spouse present.   Therapy Documentation Precautions:  Precautions Precautions: Fall Precaution Comments:   Required Braces or Orthoses: Other Brace/Splint Other Brace/Splint: Cam boot LLE, Lt shoulder sling Restrictions Weight Bearing Restrictions: Yes LUE Weight Bearing: Non weight bearing LLE Weight Bearing: Non weight bearing  Vital Signs: Therapy Vitals Temp: 98.4 F (36.9 C) Temp Source: Oral Pulse Rate: 99 Resp: 20 BP: 133/72 Patient Position (if appropriate): Sitting Oxygen Therapy SpO2: 94 % O2 Device: Not  Delivered Pain: Pain Assessment Pain Assessment: Faces Faces Pain Scale: Hurts little more Pain Type: Acute pain Pain Location: Shoulder Pain Orientation: Left;Anterior Pain Descriptors / Indicators: Discomfort Pain Onset: With Activity Pain Intervention(s): Repositioned Multiple Pain Sites: No ADL: See Function Navigator for Current Functional Status.   Therapy/Group: Individual Therapy  Apollo Timothy OTR/L 08/15/2016, 4:43 PM

## 2016-08-16 ENCOUNTER — Inpatient Hospital Stay (HOSPITAL_COMMUNITY): Payer: BLUE CROSS/BLUE SHIELD | Admitting: Occupational Therapy

## 2016-08-16 ENCOUNTER — Inpatient Hospital Stay (HOSPITAL_COMMUNITY): Payer: BLUE CROSS/BLUE SHIELD | Admitting: Physical Therapy

## 2016-08-16 DIAGNOSIS — K5900 Constipation, unspecified: Secondary | ICD-10-CM

## 2016-08-16 DIAGNOSIS — K219 Gastro-esophageal reflux disease without esophagitis: Secondary | ICD-10-CM
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
## 2016-08-16 NOTE — Progress Notes (Signed)
Occupational Therapy Session Note  Patient Details  Name: Daryl Cook MRN: [id number] Date of Birth: 06/15/1955  Today's Date: 08/16/2016 OT Individual Time: 1245-8099 OT Individual Time Calculation (min): 59 min    Short Term Goals: Week 1:  OT Short Term Goal 1 (Week 1): STGs equal to LTGs set at modified independent level based on ELOS.   Skilled Therapeutic Interventions/Progress Updates:    Tx focus on caregiver education during bathing/dressing routine.   Pt greeted supine in bed. Significant other, Abigail Butts present. Pt reported that when he goes home, he plans to bathe at sink until L UE pain subsides. Pt reported having great difficultly in shower yesterday due to L UE and wishes to wait for showering at this time. Therefore, B/D session took place w/c level at sink. Abigail Butts was provided hands on training for providing supervision assist during functional transfers and physical assist as needed during ADLs and donning orthoses (when pt fatigued/experienced pain increase in L UE). Per Abigail Butts, she will be home with pt 24/7, taking time off from work, to be with pt for all self care needs. Discussed safe home modifications, importance of dressing L UE and UB first, and undressing UB last to maximize time with sling donned for increased LUE comfort and support. Trained Abigail Butts in providing supervision assist during stand pivot bathroom transfers (without grab bars, using toilet bars on BSC placed over toilet per home). Abigail Butts was signed as trained caregiver on safety plan. At end of tx pt was provided with ice for pain mgt. Pt directing care to [place] with mgt of w/c parts. He was left with Abigail Butts at time of departure.   With all functional transfers, pt was able to maintain NWB status of L UE/LE.   Therapy Documentation Precautions:  Precautions Precautions: Fall Precaution Comments:   Required Braces or Orthoses: Other Brace/Splint Other Brace/Splint: Cam boot LLE, Lt shoulder  sling Restrictions Weight Bearing Restrictions: Yes LUE Weight Bearing: Non weight bearing LLE Weight Bearing: Non weight bearing   Pain: Pt medicated prior to session, took rest breaks frequently  Pain Assessment Pain Assessment: 0-10 Pain Score: 4  Pain Type: Acute pain Pain Location: Shoulder Pain Orientation: Left Pain Descriptors / Indicators: Aching Pain Frequency: Constant Pain Onset: On-going Patients Stated Pain Goal: 0 Pain Intervention(s): Medication (See eMAR) ADL:      See Function Navigator for Current Functional Status.   Therapy/Group: Individual Therapy  Addi Pak A Mikhai Bienvenue 08/16/2016, 12:24 PM

## 2016-08-16 NOTE — Plan of Care (Signed)
Problem: Activity: Goal: Ability to avoid complications of mobility impairment will improve Outcome: Progressing No complications noted  Problem: Physical Regulation: Goal: Postoperative complications will be avoided or minimized Outcome: Progressing No post op. Complications noted  Problem: Pain Management: Goal: Pain level will decrease with appropriate interventions Outcome: Not Progressing complained of pain scael of 8 and stated that pain is constant   Problem: Skin Integrity: Goal: Signs of wound healing will improve Outcome: Progressing Abrasions to legs and arm dry and scabbing, signs of infection noted

## 2016-08-16 NOTE — Plan of Care (Signed)
Problem: Pain Management: Goal: Pain level will decrease with appropriate interventions Outcome: Not Progressing Pain levels frequently rated >6.

## 2016-08-16 NOTE — Progress Notes (Signed)
Physical Therapy Session Note  Patient Details  Name: Saeed Toren MRN: [id number] Date of Birth: 1956/01/21  Today's Date: 08/16/2016 PT Individual Time: 1100-1200   62mn  Short Term Goals: Week 1:  PT Short Term Goal 1 (Week 1): STG=LTG due to ELOS  Skilled Therapeutic Interventions/Progress Updates:   WC mobility in vairous environments including hall of hospital, entrance of hospital on uneven cement. Supervision assist on unlevel surface for improved control on descent of unlevel surface. Mod I on level surfaces.   D/c planning for access to home with education to pt and spouse for safe entrance into home.   Stair management with WC with one and 2 helpers. Anterior ascent for single step. Max verbal and visual cues for safety and technique. 2 helpers to [facility]end [facility] cues for technique and safety to prevent injury to pt and spouse. Pt agreed that ramp would bed safer option to enter house, and reports plans to have ramp built before D/c.   Patient returned too room and [facility] reach and all needs met.        Therapy Documentation Precautions:  Precautions Precautions: Fall Precaution Comments:   Required Braces or Orthoses: Other Brace/Splint Other Brace/Splint: Cam boot LLE, Lt shoulder sling Restrictions Weight Bearing Restrictions: Yes LUE Weight Bearing: Non weight bearing LLE Weight Bearing: Non weight bearing Vital Signs: Therapy Vitals Temp: 97.7 F (36.5 C) Temp Source: Oral Pulse Rate: 94 Resp: 18 BP: 119/81 Patient Position (if appropriate): Lying Oxygen Therapy SpO2: 94 % O2 Device: Not Delivered Pain:   6/10 shoulder  See Function Navigator for Current Functional Status.   Therapy/Group: Individual Therapy  ALorie Phenix6/16/2018, 7:56 AM

## 2016-08-16 NOTE — Progress Notes (Signed)
Occupational Therapy Session Note  Patient Details  Name: Daryl Cook MRN: [id number] Date of Birth: May 02, 1955  Today's Date: 08/16/2016 OT Individual Time: 4098-1191 OT Individual Time Calculation (min): 54 min    Short Term Goals: Week 1:  OT Short Term Goal 1 (Week 1): STGs equal to LTGs set at modified independent level based on ELOS.   Skilled Therapeutic Interventions/Progress Updates:    Upon entering the room, pt seated in wheelchair with wife present in room for continued family education. Wife reports that she feels comfortable assisting pt as needed and has practiced a variety of situations with staff already. Pt requesting outpatient services at discharge and OT providing education regarding expectations in this session as well as d/c equipment recommendations and home set up. Pt requesting to practice transfer onto TTB placed in tub shower combination. Pt managed wheelchair set up and leg rest with steady assistance for squat pivot onto TTB from wheelchair. Pt reporting wheelchair would fit into bathroom and would have space to place it right next to bench for transfer as practices. OT also recommended purchase of safety treads to decrease fall risk. Pt propelled wheelchair over graded incline surface with supervision but began to c/o pain in L UE. RN arrived with medication this session. Pt returned to room with call bell and all needed items within reach upon exiting the room.   Therapy Documentation Precautions:  Precautions Precautions: Fall Precaution Comments:   Required Braces or Orthoses: Other Brace/Splint Other Brace/Splint: Cam boot LLE, Lt shoulder sling Restrictions Weight Bearing Restrictions: Yes LUE Weight Bearing: Non weight bearing LLE Weight Bearing: Non weight bearing General:   Vital Signs:   Pain: Pain Assessment Pain Score: 4  2nd Pain Site Pain Score: 8 Pain Type: Acute pain Pain Location: Shoulder Pain Orientation: Left Pain  Descriptors / Indicators: Aching Pain Frequency: Constant Pain Onset: On-going Patient's Stated Pain Goal: 0 Pain Intervention(s): Medication (See eMAR)  See Function Navigator for Current Functional Status.   Therapy/Group: Individual Therapy  Gypsy Decant 08/16/2016, 1:59 PM

## 2016-08-17 ENCOUNTER — Inpatient Hospital Stay (HOSPITAL_COMMUNITY): Payer: BLUE CROSS/BLUE SHIELD

## 2016-08-17 ENCOUNTER — Inpatient Hospital Stay (HOSPITAL_COMMUNITY): Payer: BLUE CROSS/BLUE SHIELD | Admitting: Physical Therapy

## 2016-08-17 DIAGNOSIS — S82402D Unspecified fracture of shaft of left fibula, subsequent encounter for closed fracture with routine healing: Secondary | ICD-10-CM
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
## 2016-08-17 NOTE — Discharge Summary (Signed)
Discharge summary job # [id number]

## 2016-08-17 NOTE — Progress Notes (Signed)
Occupational Therapy Discharge Summary  Patient Details  Name: Daryl Cook MRN: [id number] Date of Birth: Jul 20, 1955  Today's Date: 08/17/2016 OT Individual Time: 1000-1056 and 1415-1526 OT Individual Time Calculation (min): 56 min and 71 min  Session 1: 1:1. Pt wife present during session assisting with providing supervision throughout transfers as educated in previous sessions. Pt asleep on arrival but easily aroused. Pt and wife reporting already showered/dressed this AM, however willing to simulate/demonstrate strategies used at sink. Pt able to wash/reach 9/9 body parts (LLE covered for shower) with supervision for sit to stand at sink. Pt dons pull over shirt dressing LUE first, CAM boot, and sling with MOD I and underwear and pants with supervision for sit to stand level. Pt completes stand pivot toilet transfer and simulated toileting with supervision. Pt completes stand pivot tub transfer, bed transfer, couch transfer and chair transfer with supervision and VC for locking w/c brakes to simulate home environment. Pt propels w/c throughout session with MOD I. Pt propels w/c throughout kitchen to obtain items from cabinets at various levels to simulate retrieving snack from home environment with VC for proper w/c positioning to maximize reach and safety. Exited session with pt seated in w/c, wife present, and ice pack applied to L shoulder.  Session 2:  1:1. Pain reported in LUE, however RN already aware and not able to administer any medication. Focus of session on simple meal prep, endurance, w/c propulsion, and sit to stand. Educated pt on energy conservation strategies and positioning in kitchen for cooking at w/c level. Pt gathers all needed items, fries an egg and places items in sink to be washed with supervision and increased time. Pt propels w/c with hemi technique to/from all tx destinations with rest breaks 2/2 fatigue and pain in LUE. Outside in courtyard pt stand pivot  transfer to standard chair with supervision. In seated and standing pt plays board game with supervision to improve standing balance and tolerance required for ADLs. Exited session with pt seated in w/c with call light in reach and wife present in room.   Patient has met 11 of 11 long term goals due to improved activity tolerance, improved balance and ability to compensate for deficits.  Patient to discharge at overall Supervision -MOD I level.  Patient's care partner is independent to provide the necessary supervision assistance at discharge.    Reasons goals not met: n/a  Recommendation:  No follow up OT recommended until shoulder heals  Equipment: TTB; BSC  Reasons for discharge: treatment goals met and discharge from hospital  Patient/family agrees with progress made and goals achieved: Yes  OT Discharge Precautions/Restrictions  Precautions Precautions: Fall Required Braces or Orthoses: Other Brace/Splint Other Brace/Splint: Cam boot LLE, Lt shoulder sling Restrictions Weight Bearing Restrictions: Yes LUE Weight Bearing: Non weight bearing LLE Weight Bearing: Non weight bearing General   Vital Signs   Pain Pain Assessment Pain Assessment: 0-10 Pain Score: 9  Pain Type: Acute pain Pain Location: Shoulder Pain Orientation: Left Pain Descriptors / Indicators: Aching;Throbbing Pain Frequency: Constant Pain Onset: On-going Patients Stated Pain Goal: 0 Pain Intervention(s): Medication (See eMAR) Multiple Pain Sites: No ADL   Vision Baseline Vision/History: Wears glasses Wears Glasses: At all times Patient Visual Report: No change from baseline Vision Assessment?: No apparent visual deficits Eye Alignment: Within Functional Limits Perception  Perception: Within Functional Limits Praxis Praxis: Intact Cognition Overall Cognitive Status: Within Functional Limits for tasks assessed Arousal/Alertness: Awake/alert Orientation Level: Oriented X4 Attention:  Selective Selective Attention: Appears  intact Memory: Appears intact Awareness: Appears intact Problem Solving: Appears intact Safety/Judgment: Appears intact Sensation Sensation Light Touch: Appears Intact Stereognosis: Appears Intact Hot/Cold: Appears Intact Proprioception: Appears Intact Coordination Gross Motor Movements are Fluid and Coordinated: Yes Fine Motor Movements are Fluid and Coordinated: Yes Coordination and Movement Description: Pt with only limited use of the LUE at the hand and elbow secondary to clavicle fracture. Motor  Motor Motor: Within Functional Limits Motor - Skilled Clinical Observations: restrictions in Lt UE/LE due to injuries and restrictions Mobility  Bed Mobility Bed Mobility: Supine to Sit;Sit to Supine Supine to Sit: 6: Modified independent (Device/Increase time) Sit to Supine: 6: Modified independent (Device/Increase time) Transfers Transfers: Sit to Stand;Stand to Sit Sit to Stand: 5: Supervision Stand to Sit: 5: Supervision  Trunk/Postural Assessment  Cervical Assessment Cervical Assessment: Within Functional Limits Thoracic Assessment Thoracic Assessment: Within Functional Limits Lumbar Assessment Lumbar Assessment: Within Functional Limits Postural Control Postural Control: Within Functional Limits  Balance Static Sitting Balance Static Sitting - Balance Support: Right upper extremity supported;Feet supported Static Sitting - Level of Assistance: 6: Modified independent (Device/Increase time) Dynamic Sitting Balance Dynamic Sitting - Balance Support: Right upper extremity supported;Feet supported Dynamic Sitting - Level of Assistance: 6: Modified independent (Device/Increase time) Static Standing Balance Static Standing - Balance Support: Right upper extremity supported Static Standing - Level of Assistance: 5: Stand by assistance Dynamic Standing Balance Dynamic Standing - Balance Support: Right upper extremity supported;During  functional activity Dynamic Standing - Level of Assistance: 5: Stand by assistance Extremity/Trunk Assessment RUE Assessment RUE Assessment: Within Functional Limits LUE Assessment LUE Assessment: Exceptions to [facility] (no active/passive range attempted at time)   See Function Navigator for Current Functional Status.  Elenore Paddy Tunis Gentle 08/17/2016, 12:31 PM

## 2016-08-17 NOTE — Plan of Care (Signed)
Problem: Pain Management: Goal: Pain level will decrease with appropriate interventions Outcome: Not Progressing Pt reports pain levels > 5.

## 2016-08-17 NOTE — Progress Notes (Signed)
Physical Therapy Discharge Summary  Patient Details  Name: Daryl Cook MRN: [id number] Date of Birth: 04/16/1955  Today's Date: 08/17/2016 PT Individual Time: 0800-0900 PT Individual Time Calculation (min): 60 min    Patient has met 4 of 5 long term goals due to improved balance, increased strength and ability to compensate for deficits.  Patient to discharge at a wheelchair level Supervision.   Patient's care partner is independent to provide the necessary physical assistance at discharge.  Reasons goals not met: Pt met 4 out of 5 goals, unable to meet stand pivot transfer goal because pt occasionally requires verbal cues during transfer for safety.   Recommendation:  Patient will benefit from ongoing skilled PT services in home health setting to continue to advance safe functional mobility, address ongoing impairments in strength and balance, and minimize fall risk.  Equipment: w/c   Reasons for discharge: treatment goals met  Patient/family agrees with progress made and goals achieved: Yes  PT Discharge Precautions/Restrictions Precautions Precautions: Fall Required Braces or Orthoses: Other Brace/Splint Other Brace/Splint: Cam boot LLE, Lt shoulder sling Restrictions Weight Bearing Restrictions: Yes LUE Weight Bearing: Non weight bearing LLE Weight Bearing: Non weight bearing Vital Signs Pain Pt c/o 7/10 L shoulder pain.  Vision/Perception  Wears glasses all the time Perception Perception: Within Functional Limits Cognition Overall Cognitive Status: Within Functional Limits for tasks assessed Arousal/Alertness: Awake/alert Orientation Level: Oriented X4 Memory: Appears intact Awareness: Appears intact Problem Solving: Appears intact Safety/Judgment: Appears intact Motor  Motor Motor: Within Functional Limits Motor - Skilled Clinical Observations: restrictions in Lt UE/LE due to injuries and restrictions  Mobility Bed Mobility Bed Mobility: Supine to  Sit;Sit to Supine Supine to Sit: 6: Modified independent (Device/Increase time) Sit to Supine: 6: Modified independent (Device/Increase time) Transfers Transfers: Yes Sit to Stand: 5: Supervision Stand to Sit: 5: Supervision Stand Pivot Transfers: 5: Supervision Locomotion  Ambulation Ambulation: No Wheelchair Mobility Wheelchair Mobility: Yes Wheelchair Assistance: 6: Modified independent (Device/Increase time) Wheelchair Propulsion: Right lower extremity;Right upper extremity  Trunk/Postural Assessment  Cervical Assessment Cervical Assessment: Within Water engineer Thoracic Assessment: Within Functional Limits Lumbar Assessment Lumbar Assessment: Within Functional Limits  Balance Static Sitting Balance Static Sitting - Level of Assistance: 6: Modified independent (Device/Increase time) Static Standing Balance Static Standing - Balance Support: Right upper extremity supported Static Standing - Level of Assistance: 5: Stand by assistance Dynamic Standing Balance Dynamic Standing - Balance Support: Right upper extremity supported;During functional activity Dynamic Standing - Level of Assistance: 5: Stand by assistance Extremity Assessment  RLE Assessment RLE Assessment: Within Functional Limits LLE Assessment LLE Assessment: Exceptions to [facility]   See Function Navigator for Current Functional Status.  Pt was seen bedside in the am for treatment. Pt performed bed mobility with mod I. Pt performed multiple stand pivot transfers with mod I to occasional S for safety and speed of transfer. Pt would also benefit from brake extension on w/c break handles to increase ease of use. Pt was able to propel w/c with mod I. [name] present during treatment. Discussed S level and need for verbal cues at times for safety and to decrease speed of transfer. Pt and wife verbalized understanding. Following treatment pt returned to room and left sitting up in w/c with all  needs within reach.   Daryl Cook 08/17/2016, 12:09 PM

## 2016-08-18 ENCOUNTER — Encounter (HOSPITAL_COMMUNITY): Payer: Self-pay

## 2016-08-18 DIAGNOSIS — S82402S Unspecified fracture of shaft of left fibula, sequela: Secondary | ICD-10-CM

## 2016-08-18 MED ORDER — POLYETHYLENE GLYCOL 3350 17 G PO PACK: 17.0000 g | PACK | Freq: Every day | ORAL | 0 refills | Status: DC

## 2016-08-18 MED ORDER — METHOCARBAMOL 500 MG PO TABS: 500.0000 mg | ORAL_TABLET | Freq: Four times a day (QID) | ORAL | 0 refills | Status: DC | PRN

## 2016-08-18 MED ORDER — OXYCODONE HCL 10 MG PO TABS: 10.0000 mg | ORAL_TABLET | ORAL | 0 refills | Status: DC | PRN
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
## 2016-08-18 NOTE — Progress Notes (Signed)
Pt. Got the supplies,discharge instructions and follow up appointments.Pt. Ready to go home with his wife.

## 2016-08-18 NOTE — Progress Notes (Signed)
Social Work  Discharge Note  The overall goal for the admission was met for:   Discharge location: Yes - home with wife  Length of Stay: Yes - 5 days  Discharge activity level: Yes - modified independent @ w/c level  Home/community participation: Yes  Services provided included: MD, RD, PT, OT, RN, TR, Pharmacy and [place]: Private Insurance: [place]  Follow-up services arranged: Home Health: RN, PT via [facility], DME: 18x18 lightweight w/c with ELRs, cushion, 3n1 commode and tub bench via [facility] and Patient/Family has no preference for HH/DME agencies  Comments (or additional information):  Patient/Family verbalized understanding of follow-up arrangements: Yes  Individual responsible for coordination of the follow-up plan: pt  Confirmed correct DME delivered: Siler Mavis 08/18/2016    Trisha Morandi
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
## 2016-08-20 ENCOUNTER — Ambulatory Visit (INDEPENDENT_AMBULATORY_CARE_PROVIDER_SITE_OTHER): Payer: BLUE CROSS/BLUE SHIELD | Admitting: Internal Medicine

## 2016-08-20 ENCOUNTER — Encounter: Payer: Self-pay | Admitting: Internal Medicine

## 2016-08-20 VITALS — BP 122/70 | HR 101 | Temp 98.1°F

## 2016-08-20 DIAGNOSIS — S82302S Unspecified fracture of lower end of left tibia, sequela: Secondary | ICD-10-CM

## 2016-08-20 DIAGNOSIS — J939 Pneumothorax, unspecified: Secondary | ICD-10-CM

## 2016-08-20 NOTE — Assessment & Plan Note (Signed)
Following with Dr Sharol Given
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
## 2016-08-20 NOTE — Assessment & Plan Note (Signed)
Only seen on CT scan No respiratory symptoms

## 2016-08-21 ENCOUNTER — Ambulatory Visit (INDEPENDENT_AMBULATORY_CARE_PROVIDER_SITE_OTHER): Payer: BLUE CROSS/BLUE SHIELD | Admitting: Orthopedic Surgery

## 2016-08-21 ENCOUNTER — Encounter (INDEPENDENT_AMBULATORY_CARE_PROVIDER_SITE_OTHER): Payer: Self-pay | Admitting: Orthopedic Surgery

## 2016-08-21 ENCOUNTER — Ambulatory Visit (INDEPENDENT_AMBULATORY_CARE_PROVIDER_SITE_OTHER): Payer: Self-pay

## 2016-08-21 VITALS — Ht 68.0 in | Wt 178.0 lb

## 2016-08-21 DIAGNOSIS — S42025A Nondisplaced fracture of shaft of left clavicle, initial encounter for closed fracture: Secondary | ICD-10-CM | POA: Insufficient documentation

## 2016-08-21 DIAGNOSIS — M898X1 Other specified disorders of bone, shoulder: Secondary | ICD-10-CM

## 2016-08-21 DIAGNOSIS — S42025D Nondisplaced fracture of shaft of left clavicle, subsequent encounter for fracture with routine healing: Secondary | ICD-10-CM

## 2016-08-21 DIAGNOSIS — S82872S Displaced pilon fracture of left tibia, sequela: Secondary | ICD-10-CM

## 2016-08-21 DIAGNOSIS — M25572 Pain in left ankle and joints of left foot: Secondary | ICD-10-CM | POA: Insufficient documentation

## 2016-08-21 MED ORDER — OXYCODONE-ACETAMINOPHEN 10-325 MG PO TABS: 1.0000 | ORAL_TABLET | ORAL | 0 refills | Status: DC | PRN
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
## 2016-08-26 ENCOUNTER — Telehealth (INDEPENDENT_AMBULATORY_CARE_PROVIDER_SITE_OTHER): Payer: Self-pay | Admitting: Orthopedic Surgery

## 2016-08-26 ENCOUNTER — Other Ambulatory Visit (INDEPENDENT_AMBULATORY_CARE_PROVIDER_SITE_OTHER): Payer: Self-pay | Admitting: Orthopedic Surgery

## 2016-08-26 MED ORDER — OXYCODONE-ACETAMINOPHEN 10-325 MG PO TABS: 1.0000 | ORAL_TABLET | Freq: Three times a day (TID) | ORAL | 0 refills | Status: DC | PRN

## 2016-08-26 NOTE — Telephone Encounter (Signed)
Patient called needing Rx refilled (Oxycodone) Patient advised he has 2 tabs left. The number to contact patient is [phone]

## 2016-08-26 NOTE — Telephone Encounter (Signed)
08/09/16 ORIF left pilon fib fx anterior compartment release and nerve decompression of superficial peroneal nerve. Is requesting a refill on percocet 10/325. Last refill #30 5 days ago also  08/18/16 oxycodone 10mg  # 30

## 2016-08-26 NOTE — Telephone Encounter (Signed)
Rx written.

## 2016-08-26 NOTE — Telephone Encounter (Signed)
Called pt to advise that rx is at the front desk for pick up.  

## 2016-08-27 ENCOUNTER — Ambulatory Visit: Payer: BLUE CROSS/BLUE SHIELD | Admitting: Internal Medicine

## 2016-08-28 ENCOUNTER — Encounter (INDEPENDENT_AMBULATORY_CARE_PROVIDER_SITE_OTHER): Payer: Self-pay | Admitting: Orthopedic Surgery

## 2016-08-28 ENCOUNTER — Ambulatory Visit (INDEPENDENT_AMBULATORY_CARE_PROVIDER_SITE_OTHER): Payer: BLUE CROSS/BLUE SHIELD | Admitting: Orthopedic Surgery

## 2016-08-28 VITALS — Ht 68.0 in | Wt 178.0 lb

## 2016-08-28 DIAGNOSIS — S82872S Displaced pilon fracture of left tibia, sequela: Secondary | ICD-10-CM

## 2016-08-28 DIAGNOSIS — S42025D Nondisplaced fracture of shaft of left clavicle, subsequent encounter for fracture with routine healing: Secondary | ICD-10-CM
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
## 2016-08-29 ENCOUNTER — Other Ambulatory Visit: Payer: Self-pay | Admitting: Physical Medicine & Rehabilitation

## 2016-09-01 ENCOUNTER — Other Ambulatory Visit (INDEPENDENT_AMBULATORY_CARE_PROVIDER_SITE_OTHER): Payer: Self-pay | Admitting: Orthopedic Surgery

## 2016-09-01 ENCOUNTER — Telehealth (INDEPENDENT_AMBULATORY_CARE_PROVIDER_SITE_OTHER): Payer: Self-pay | Admitting: *Deleted

## 2016-09-01 MED ORDER — OXYCODONE HCL 10 MG PO TABS: 10.0000 mg | ORAL_TABLET | ORAL | 0 refills | Status: DC | PRN

## 2016-09-01 NOTE — Telephone Encounter (Signed)
Patient advised rx at front desk.

## 2016-09-01 NOTE — Telephone Encounter (Signed)
Pt calling for oxycodone refill 

## 2016-09-01 NOTE — Telephone Encounter (Signed)
rx written

## 2016-09-04 DIAGNOSIS — S2242XD Multiple fractures of ribs, left side, subsequent encounter for fracture with routine healing: Secondary | ICD-10-CM | POA: Diagnosis not present

## 2016-09-04 DIAGNOSIS — S42002D Fracture of unspecified part of left clavicle, subsequent encounter for fracture with routine healing: Secondary | ICD-10-CM | POA: Diagnosis not present

## 2016-09-04 DIAGNOSIS — S82872D Displaced pilon fracture of left tibia, subsequent encounter for closed fracture with routine healing: Secondary | ICD-10-CM | POA: Diagnosis not present

## 2016-09-04 DIAGNOSIS — S42102D Fracture of unspecified part of scapula, left shoulder, subsequent encounter for fracture with routine healing: Secondary | ICD-10-CM | POA: Diagnosis not present

## 2016-09-05 ENCOUNTER — Telehealth (INDEPENDENT_AMBULATORY_CARE_PROVIDER_SITE_OTHER): Payer: Self-pay | Admitting: Orthopedic Surgery

## 2016-09-05 NOTE — Telephone Encounter (Signed)
08/09/16 ORIF left pilon fib fx anterior compartment release and nerve decompression of superficial peroneal nerve and prevena vac   Pt has had Percocet 10/325 and oxycodone 10 mg a qty total of 120 tabs in the past two weeks and is requesting refill. Last refill was 4 days ago.

## 2016-09-05 NOTE — Telephone Encounter (Signed)
Called pt to advise that per Dr. Sharol Given he can not refill the medication and pt voiced understanding. He will call with any additional questions.

## 2016-09-05 NOTE — Telephone Encounter (Signed)
Call patient. Cannot call in a refill at this time

## 2016-09-05 NOTE — Telephone Encounter (Signed)
PT REQUESTING REFILL OF PAIN MEDS AS HE WON'T HAVE ENOUGH TO GET THROUGH THE WEEKEND.  [phone]

## 2016-09-09 ENCOUNTER — Telehealth (INDEPENDENT_AMBULATORY_CARE_PROVIDER_SITE_OTHER): Payer: Self-pay | Admitting: Orthopedic Surgery

## 2016-09-09 NOTE — Telephone Encounter (Signed)
Patient called advised he is still in a lot of pain. The number to contact patient is [phone]

## 2016-09-10 NOTE — Telephone Encounter (Signed)
Patient already has appointment tomorrow, I spoke with him this morning and denies fever,chills,sweats, or nausea. Offered appointment today but declined he would like to wait until tomorrow.

## 2016-09-11 ENCOUNTER — Ambulatory Visit (INDEPENDENT_AMBULATORY_CARE_PROVIDER_SITE_OTHER): Payer: BLUE CROSS/BLUE SHIELD

## 2016-09-11 ENCOUNTER — Ambulatory Visit (INDEPENDENT_AMBULATORY_CARE_PROVIDER_SITE_OTHER): Payer: BLUE CROSS/BLUE SHIELD | Admitting: Orthopedic Surgery

## 2016-09-11 DIAGNOSIS — S82872S Displaced pilon fracture of left tibia, sequela: Secondary | ICD-10-CM

## 2016-09-11 DIAGNOSIS — S2242XA Multiple fractures of ribs, left side, initial encounter for closed fracture: Secondary | ICD-10-CM

## 2016-09-11 DIAGNOSIS — S42025D Nondisplaced fracture of shaft of left clavicle, subsequent encounter for fracture with routine healing: Secondary | ICD-10-CM

## 2016-09-11 MED ORDER — OXYCODONE-ACETAMINOPHEN 10-325 MG PO TABS: 1.0000 | ORAL_TABLET | Freq: Two times a day (BID) | ORAL | 0 refills | Status: DC | PRN
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
## 2016-09-12 ENCOUNTER — Encounter: Payer: Self-pay | Admitting: Physical Medicine & Rehabilitation

## 2016-09-12 ENCOUNTER — Encounter
Payer: BLUE CROSS/BLUE SHIELD | Attending: Physical Medicine & Rehabilitation | Admitting: Physical Medicine & Rehabilitation

## 2016-09-12 VITALS — BP 157/81 | HR 83

## 2016-09-12 DIAGNOSIS — E785 Hyperlipidemia, unspecified: Secondary | ICD-10-CM | POA: Insufficient documentation

## 2016-09-12 DIAGNOSIS — M25572 Pain in left ankle and joints of left foot: Secondary | ICD-10-CM | POA: Diagnosis not present

## 2016-09-12 DIAGNOSIS — K219 Gastro-esophageal reflux disease without esophagitis: Secondary | ICD-10-CM | POA: Insufficient documentation

## 2016-09-12 DIAGNOSIS — K5903 Drug induced constipation: Secondary | ICD-10-CM

## 2016-09-12 DIAGNOSIS — Z87891 Personal history of nicotine dependence: Secondary | ICD-10-CM | POA: Diagnosis not present

## 2016-09-12 DIAGNOSIS — R030 Elevated blood-pressure reading, without diagnosis of hypertension: Secondary | ICD-10-CM | POA: Diagnosis not present

## 2016-09-12 DIAGNOSIS — R269 Unspecified abnormalities of gait and mobility: Secondary | ICD-10-CM

## 2016-09-12 DIAGNOSIS — S27321D Contusion of lung, unilateral, subsequent encounter: Secondary | ICD-10-CM | POA: Insufficient documentation

## 2016-09-12 DIAGNOSIS — S2242XA Multiple fractures of ribs, left side, initial encounter for closed fracture: Secondary | ICD-10-CM | POA: Diagnosis not present

## 2016-09-12 DIAGNOSIS — M2550 Pain in unspecified joint: Secondary | ICD-10-CM | POA: Diagnosis not present

## 2016-09-12 DIAGNOSIS — S82402D Unspecified fracture of shaft of left fibula, subsequent encounter for closed fracture with routine healing: Secondary | ICD-10-CM | POA: Insufficient documentation

## 2016-09-12 DIAGNOSIS — T1490XA Injury, unspecified, initial encounter: Secondary | ICD-10-CM | POA: Diagnosis not present

## 2016-09-12 DIAGNOSIS — M791 Myalgia: Secondary | ICD-10-CM | POA: Insufficient documentation

## 2016-09-12 DIAGNOSIS — S82872D Displaced pilon fracture of left tibia, subsequent encounter for closed fracture with routine healing: Secondary | ICD-10-CM | POA: Insufficient documentation

## 2016-09-12 DIAGNOSIS — G8918 Other acute postprocedural pain: Secondary | ICD-10-CM

## 2016-09-12 DIAGNOSIS — Z8619 Personal history of other infectious and parasitic diseases: Secondary | ICD-10-CM | POA: Insufficient documentation

## 2016-09-12 DIAGNOSIS — Z8262 Family history of osteoporosis: Secondary | ICD-10-CM | POA: Diagnosis not present

## 2016-09-12 DIAGNOSIS — S42002D Fracture of unspecified part of left clavicle, subsequent encounter for fracture with routine healing: Secondary | ICD-10-CM | POA: Diagnosis not present

## 2016-09-12 DIAGNOSIS — S2242XD Multiple fractures of ribs, left side, subsequent encounter for fracture with routine healing: Secondary | ICD-10-CM | POA: Diagnosis not present
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
## 2016-09-12 NOTE — Addendum Note (Signed)
Addended by: Delice Lesch A on: 09/12/2016 11:34 AM   Modules accepted: Level of Service

## 2016-09-29 ENCOUNTER — Ambulatory Visit (INDEPENDENT_AMBULATORY_CARE_PROVIDER_SITE_OTHER): Payer: BLUE CROSS/BLUE SHIELD | Admitting: Orthopedic Surgery

## 2016-09-29 ENCOUNTER — Ambulatory Visit (INDEPENDENT_AMBULATORY_CARE_PROVIDER_SITE_OTHER): Payer: BLUE CROSS/BLUE SHIELD

## 2016-09-29 ENCOUNTER — Encounter (INDEPENDENT_AMBULATORY_CARE_PROVIDER_SITE_OTHER): Payer: Self-pay | Admitting: Orthopedic Surgery

## 2016-09-29 DIAGNOSIS — S2242XA Multiple fractures of ribs, left side, initial encounter for closed fracture: Secondary | ICD-10-CM

## 2016-09-29 DIAGNOSIS — S82872S Displaced pilon fracture of left tibia, sequela: Secondary | ICD-10-CM

## 2016-09-29 DIAGNOSIS — S42025D Nondisplaced fracture of shaft of left clavicle, subsequent encounter for fracture with routine healing: Secondary | ICD-10-CM | POA: Diagnosis not present

## 2016-09-29 MED ORDER — OXYCODONE-ACETAMINOPHEN 10-325 MG PO TABS: 1.0000 | ORAL_TABLET | Freq: Two times a day (BID) | ORAL | 0 refills | Status: DC | PRN
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
## 2016-10-03 ENCOUNTER — Telehealth (INDEPENDENT_AMBULATORY_CARE_PROVIDER_SITE_OTHER): Payer: Self-pay | Admitting: Orthopedic Surgery

## 2016-10-03 ENCOUNTER — Ambulatory Visit (INDEPENDENT_AMBULATORY_CARE_PROVIDER_SITE_OTHER): Payer: BLUE CROSS/BLUE SHIELD | Admitting: Family

## 2016-10-03 ENCOUNTER — Encounter (INDEPENDENT_AMBULATORY_CARE_PROVIDER_SITE_OTHER): Payer: Self-pay | Admitting: Family

## 2016-10-03 ENCOUNTER — Ambulatory Visit (INDEPENDENT_AMBULATORY_CARE_PROVIDER_SITE_OTHER): Payer: BLUE CROSS/BLUE SHIELD

## 2016-10-03 VITALS — Ht 68.0 in | Wt 178.0 lb

## 2016-10-03 DIAGNOSIS — S82872S Displaced pilon fracture of left tibia, sequela: Secondary | ICD-10-CM

## 2016-10-03 DIAGNOSIS — M25572 Pain in left ankle and joints of left foot: Secondary | ICD-10-CM

## 2016-10-03 DIAGNOSIS — S82402S Unspecified fracture of shaft of left fibula, sequela: Secondary | ICD-10-CM

## 2016-10-03 DIAGNOSIS — S82302S Unspecified fracture of lower end of left tibia, sequela: Secondary | ICD-10-CM

## 2016-10-03 NOTE — Telephone Encounter (Signed)
Patient called advised he is in a lot of pain and getting worse. Patient said he is really concerned at this point and do not know what to do. The number to contact patient is [phone]
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
## 2016-10-03 NOTE — Telephone Encounter (Signed)
Called pt and advised to come in today at 10:30 for appt with erin.

## 2016-10-06 ENCOUNTER — Ambulatory Visit (INDEPENDENT_AMBULATORY_CARE_PROVIDER_SITE_OTHER): Payer: BLUE CROSS/BLUE SHIELD | Admitting: Orthopedic Surgery

## 2016-10-09 ENCOUNTER — Encounter (HOSPITAL_COMMUNITY): Payer: Self-pay | Admitting: *Deleted

## 2016-10-09 ENCOUNTER — Other Ambulatory Visit (INDEPENDENT_AMBULATORY_CARE_PROVIDER_SITE_OTHER): Payer: Self-pay | Admitting: Family

## 2016-10-09 NOTE — Progress Notes (Signed)
Pt denies SOB, chest pain, and being under the care of a cardiologist. Pt denies having a stress test, echo and cardiac cath. Pt denies having an EKG within the last year. Pt denies having recent labs. Pt made aware to stop taking Aspirin, vitamins, fish oil, and herbal medications. Do not take any NSAIDs ie: Ibuprofen, Advil, Naproxen (Aleve), Motrin, BC and Goody Powder or any medication containing Aspirin. Pt verbalized understanding of all pre-op instructions.

## 2016-10-09 NOTE — Progress Notes (Signed)
Patient called to cancel his surgery. Stated his mother was in the ER and was told she needed open heart surgery in the am. Notified OR desk of cancellation.

## 2016-10-10 ENCOUNTER — Encounter (HOSPITAL_COMMUNITY)
Admission: RE | Disposition: A | Discharge status: Home / Self Care | Payer: Self-pay | Source: Ambulatory Visit | Attending: Orthopedic Surgery

## 2016-10-10 ENCOUNTER — Telehealth (INDEPENDENT_AMBULATORY_CARE_PROVIDER_SITE_OTHER): Payer: Self-pay | Admitting: Orthopedic Surgery

## 2016-10-10 SURGERY — OPEN REDUCTION INTERNAL FIXATION (ORIF) ANKLE FRACTURE
Anesthesia: General | Laterality: Left

## 2016-10-10 NOTE — Telephone Encounter (Signed)
Patient called advised he had to cancel his surgery due to his mother getting sick and pick in the hospital. Patient also need a Rx refilled (Oxycodone) The number to contact patient is [phone]

## 2016-10-10 NOTE — Telephone Encounter (Signed)
I called pt and he states that he cx his surgery for today and will r/s next week. I advised that he was given an rx for Percocet 10/325 mg #30 it was a 15 day supply given on 10/01/16. He is not eligible for refill till 10/15/16 voiced understanding will call next week.

## 2016-10-15 ENCOUNTER — Other Ambulatory Visit (INDEPENDENT_AMBULATORY_CARE_PROVIDER_SITE_OTHER): Payer: Self-pay | Admitting: Family

## 2016-10-15 ENCOUNTER — Telehealth (INDEPENDENT_AMBULATORY_CARE_PROVIDER_SITE_OTHER): Payer: Self-pay

## 2016-10-15 NOTE — Telephone Encounter (Signed)
Rx request, pls advise.  

## 2016-10-15 NOTE — Telephone Encounter (Signed)
uc- will write rx after surgery, cannot write rx before and after surgery

## 2016-10-15 NOTE — Telephone Encounter (Signed)
Patient would like a Rx refill on Oxycodone.  Cb# is [phone].  Please Advise.  Thank You.

## 2016-10-16 ENCOUNTER — Encounter (HOSPITAL_COMMUNITY): Payer: Self-pay | Admitting: *Deleted

## 2016-10-16 MED ORDER — CEFAZOLIN SODIUM-DEXTROSE 2-4 GM/100ML-% IV SOLN
2.0000 g | INTRAVENOUS | Status: AC
  Administered 2016-10-17: 2 g via INTRAVENOUS

## 2016-10-16 NOTE — Progress Notes (Signed)
Spoke with pt for pre-op call. Pt denies cardiac history, chest pain or sob. Pt states he is not diabetic. 

## 2016-10-17 ENCOUNTER — Encounter (HOSPITAL_COMMUNITY): Payer: Self-pay | Admitting: Anesthesiology

## 2016-10-17 ENCOUNTER — Ambulatory Visit (HOSPITAL_COMMUNITY)
Admission: RE | Admit: 2016-10-17 | Discharge: 2016-10-18 | Disposition: A | Discharge status: Home / Self Care | Payer: BLUE CROSS/BLUE SHIELD | Source: Ambulatory Visit | Attending: Orthopedic Surgery | Admitting: Orthopedic Surgery

## 2016-10-17 ENCOUNTER — Ambulatory Visit (HOSPITAL_COMMUNITY)
Admission: RE | Admit: 2016-10-17 | Payer: BLUE CROSS/BLUE SHIELD | Source: Ambulatory Visit | Admitting: Orthopedic Surgery

## 2016-10-17 ENCOUNTER — Encounter (HOSPITAL_COMMUNITY): Payer: Self-pay | Admitting: Certified Registered Nurse Anesthetist

## 2016-10-17 ENCOUNTER — Encounter (HOSPITAL_COMMUNITY)
Admission: RE | Disposition: A | Discharge status: Home / Self Care | Payer: Self-pay | Source: Ambulatory Visit | Attending: Orthopedic Surgery

## 2016-10-17 DIAGNOSIS — B182 Chronic viral hepatitis C: Secondary | ICD-10-CM | POA: Diagnosis not present

## 2016-10-17 DIAGNOSIS — S82872S Displaced pilon fracture of left tibia, sequela: Secondary | ICD-10-CM

## 2016-10-17 DIAGNOSIS — T8489XA Other specified complication of internal orthopedic prosthetic devices, implants and grafts, initial encounter: Secondary | ICD-10-CM | POA: Insufficient documentation

## 2016-10-17 DIAGNOSIS — K219 Gastro-esophageal reflux disease without esophagitis: Secondary | ICD-10-CM | POA: Diagnosis not present

## 2016-10-17 DIAGNOSIS — Z87891 Personal history of nicotine dependence: Secondary | ICD-10-CM | POA: Insufficient documentation

## 2016-10-17 DIAGNOSIS — Z8262 Family history of osteoporosis: Secondary | ICD-10-CM | POA: Insufficient documentation

## 2016-10-17 DIAGNOSIS — S82402K Unspecified fracture of shaft of left fibula, subsequent encounter for closed fracture with nonunion: Secondary | ICD-10-CM

## 2016-10-17 DIAGNOSIS — Y939 Activity, unspecified: Secondary | ICD-10-CM | POA: Insufficient documentation

## 2016-10-17 DIAGNOSIS — X58XXXA Exposure to other specified factors, initial encounter: Secondary | ICD-10-CM | POA: Diagnosis not present

## 2016-10-17 DIAGNOSIS — S82202K Unspecified fracture of shaft of left tibia, subsequent encounter for closed fracture with nonunion: Secondary | ICD-10-CM | POA: Diagnosis present

## 2016-10-17 DIAGNOSIS — E785 Hyperlipidemia, unspecified: Secondary | ICD-10-CM | POA: Diagnosis not present

## 2016-10-17 HISTORY — DX: Presence of spectacles and contact lenses: Z97.3

## 2016-10-17 HISTORY — DX: Other injury of unspecified body region, initial encounter: T14.8XXA

## 2016-10-17 HISTORY — PX: ORIF TIBIA FRACTURE: SHX5416

## 2016-10-17 HISTORY — PX: OTHER SURGICAL HISTORY: SHX169

## 2016-10-17 HISTORY — PX: HARDWARE REMOVAL: SHX979

## 2016-10-17 LAB — CBC
HEMATOCRIT: 41.2 % (ref 39.0–52.0)
HEMOGLOBIN: 14.1 g/dL (ref 13.0–17.0)
MCH: 30.2 pg (ref 26.0–34.0)
MCHC: 34.2 g/dL (ref 30.0–36.0)
MCV: 88.2 fL (ref 78.0–100.0)
Platelets: 200 10*3/uL (ref 150–400)
RBC: 4.67 MIL/uL (ref 4.22–5.81)
RDW: 12.1 % (ref 11.5–15.5)
WBC: 4.7 10*3/uL (ref 4.0–10.5)

## 2016-10-17 SURGERY — OPEN REDUCTION INTERNAL FIXATION (ORIF) TIBIA FRACTURE
Anesthesia: General | Site: Leg Lower | Laterality: Left

## 2016-10-17 MED ORDER — KETOROLAC TROMETHAMINE 15 MG/ML IJ SOLN
15.0000 mg | Freq: Four times a day (QID) | INTRAMUSCULAR | Status: DC
  Administered 2016-10-17 – 2016-10-18 (×2): 15 mg via INTRAVENOUS
  Filled 2016-10-17 (×3): qty 1

## 2016-10-17 MED ORDER — METHOCARBAMOL 500 MG PO TABS
500.0000 mg | ORAL_TABLET | Freq: Four times a day (QID) | ORAL | Status: DC | PRN
  Administered 2016-10-17 – 2016-10-18 (×3): 500 mg via ORAL
  Filled 2016-10-17 (×3): qty 1

## 2016-10-17 MED ORDER — ACETAMINOPHEN 325 MG PO TABS
650.0000 mg | ORAL_TABLET | Freq: Four times a day (QID) | ORAL | Status: DC | PRN
  Administered 2016-10-17: 650 mg via ORAL
  Filled 2016-10-17: qty 2

## 2016-10-17 MED ORDER — METOCLOPRAMIDE HCL 5 MG/ML IJ SOLN: 5.0000 mg | Freq: Three times a day (TID) | INTRAMUSCULAR | Status: DC | PRN

## 2016-10-17 MED ORDER — DOCUSATE SODIUM 100 MG PO CAPS
100.0000 mg | ORAL_CAPSULE | Freq: Two times a day (BID) | ORAL | Status: DC
  Administered 2016-10-17 – 2016-10-18 (×2): 100 mg via ORAL
  Filled 2016-10-17 (×2): qty 1

## 2016-10-17 MED ORDER — MAGNESIUM CITRATE PO SOLN: 1.0000 | Freq: Once | ORAL | Status: DC | PRN

## 2016-10-17 MED ORDER — FENTANYL CITRATE (PF) 250 MCG/5ML IJ SOLN
INTRAMUSCULAR | Status: AC
  Filled 2016-10-17: qty 5

## 2016-10-17 MED ORDER — ASPIRIN EC 325 MG PO TBEC
325.0000 mg | DELAYED_RELEASE_TABLET | Freq: Every day | ORAL | Status: DC
  Administered 2016-10-17 – 2016-10-18 (×2): 325 mg via ORAL
  Filled 2016-10-17 (×2): qty 1

## 2016-10-17 MED ORDER — OXYCODONE-ACETAMINOPHEN 10-325 MG PO TABS: 1.0000 | ORAL_TABLET | ORAL | 0 refills | Status: DC | PRN

## 2016-10-17 MED ORDER — CEFAZOLIN SODIUM-DEXTROSE 2-4 GM/100ML-% IV SOLN
INTRAVENOUS | Status: AC
  Filled 2016-10-17: qty 100

## 2016-10-17 MED ORDER — SODIUM CHLORIDE 0.9 % IV SOLN
INTRAVENOUS | Status: DC
  Administered 2016-10-17: 22:00:00 via INTRAVENOUS

## 2016-10-17 MED ORDER — LACTATED RINGERS IV SOLN
INTRAVENOUS | Status: DC
  Administered 2016-10-17 (×2): via INTRAVENOUS

## 2016-10-17 MED ORDER — CEFAZOLIN SODIUM-DEXTROSE 1-4 GM/50ML-% IV SOLN
1.0000 g | Freq: Four times a day (QID) | INTRAVENOUS | Status: AC
  Administered 2016-10-17 – 2016-10-18 (×3): 1 g via INTRAVENOUS
  Filled 2016-10-17 (×3): qty 50

## 2016-10-17 MED ORDER — ONDANSETRON HCL 4 MG PO TABS: 4.0000 mg | ORAL_TABLET | Freq: Four times a day (QID) | ORAL | Status: DC | PRN

## 2016-10-17 MED ORDER — LIDOCAINE HCL (CARDIAC) 20 MG/ML IV SOLN
INTRAVENOUS | Status: DC | PRN
  Administered 2016-10-17: 50 mg via INTRAVENOUS
  Administered 2016-10-17: 60 mg via INTRAVENOUS

## 2016-10-17 MED ORDER — METOCLOPRAMIDE HCL 5 MG PO TABS: 5.0000 mg | ORAL_TABLET | Freq: Three times a day (TID) | ORAL | Status: DC | PRN

## 2016-10-17 MED ORDER — MIDAZOLAM HCL 2 MG/2ML IJ SOLN
INTRAMUSCULAR | Status: AC
Start: 2016-10-17 — End: 2016-10-17
  Administered 2016-10-17: 2 mg
  Filled 2016-10-17: qty 2

## 2016-10-17 MED ORDER — MIDAZOLAM HCL 2 MG/2ML IJ SOLN
INTRAMUSCULAR | Status: AC
  Filled 2016-10-17: qty 2

## 2016-10-17 MED ORDER — PANTOPRAZOLE SODIUM 40 MG PO TBEC
40.0000 mg | DELAYED_RELEASE_TABLET | Freq: Every day | ORAL | Status: DC
  Administered 2016-10-17 – 2016-10-18 (×2): 40 mg via ORAL
  Filled 2016-10-17 (×2): qty 1

## 2016-10-17 MED ORDER — BUPIVACAINE-EPINEPHRINE (PF) 0.5% -1:200000 IJ SOLN
INTRAMUSCULAR | Status: DC | PRN
  Administered 2016-10-17: 30 mL via PERINEURAL

## 2016-10-17 MED ORDER — 0.9 % SODIUM CHLORIDE (POUR BTL) OPTIME
TOPICAL | Status: DC | PRN
  Administered 2016-10-17: 1000 mL

## 2016-10-17 MED ORDER — ONDANSETRON HCL 4 MG/2ML IJ SOLN: 4.0000 mg | Freq: Four times a day (QID) | INTRAMUSCULAR | Status: DC | PRN

## 2016-10-17 MED ORDER — ACETAMINOPHEN 650 MG RE SUPP: 650.0000 mg | Freq: Four times a day (QID) | RECTAL | Status: DC | PRN

## 2016-10-17 MED ORDER — PROPOFOL 10 MG/ML IV BOLUS
INTRAVENOUS | Status: AC
  Filled 2016-10-17: qty 20

## 2016-10-17 MED ORDER — PHENYLEPHRINE HCL 10 MG/ML IJ SOLN
INTRAMUSCULAR | Status: DC | PRN
  Administered 2016-10-17: 20 ug/min via INTRAVENOUS

## 2016-10-17 MED ORDER — MIDAZOLAM HCL 5 MG/5ML IJ SOLN
INTRAMUSCULAR | Status: DC | PRN
  Administered 2016-10-17: 2 mg via INTRAVENOUS

## 2016-10-17 MED ORDER — METHOCARBAMOL 1000 MG/10ML IJ SOLN
500.0000 mg | Freq: Four times a day (QID) | INTRAVENOUS | Status: DC | PRN
  Filled 2016-10-17: qty 5

## 2016-10-17 MED ORDER — OXYCODONE HCL 5 MG PO TABS
5.0000 mg | ORAL_TABLET | ORAL | Status: DC | PRN
  Administered 2016-10-17 – 2016-10-18 (×5): 10 mg via ORAL
  Filled 2016-10-17 (×5): qty 2

## 2016-10-17 MED ORDER — CHLORHEXIDINE GLUCONATE 4 % EX LIQD: 60.0000 mL | Freq: Once | CUTANEOUS | Status: DC

## 2016-10-17 MED ORDER — POLYETHYLENE GLYCOL 3350 17 G PO PACK: 17.0000 g | PACK | Freq: Every day | ORAL | Status: DC | PRN

## 2016-10-17 MED ORDER — PROPOFOL 10 MG/ML IV BOLUS
INTRAVENOUS | Status: DC | PRN
  Administered 2016-10-17: 200 mg via INTRAVENOUS

## 2016-10-17 MED ORDER — FENTANYL CITRATE (PF) 100 MCG/2ML IJ SOLN
INTRAMUSCULAR | Status: AC
  Administered 2016-10-17: 50 ug
  Filled 2016-10-17: qty 2

## 2016-10-17 MED ORDER — HYDROMORPHONE HCL 1 MG/ML IJ SOLN: 1.0000 mg | INTRAMUSCULAR | Status: DC | PRN

## 2016-10-17 MED ORDER — BISACODYL 10 MG RE SUPP: 10.0000 mg | Freq: Every day | RECTAL | Status: DC | PRN

## 2016-10-17 SURGICAL SUPPLY — 69 items
BANDAGE ESMARK 6X9 LF (GAUZE/BANDAGES/DRESSINGS) IMPLANT
BIT DRILL 2.5X110 QC LCP DISP (BIT) ×4 IMPLANT
BIT DRILL 2.8 (BIT) ×2
BIT DRILL CANN QC 2.8X165 (BIT) ×2 IMPLANT
BLADE SAW SGTL 18.5X63.X.64 HD (BLADE) ×4 IMPLANT
BNDG COHESIVE 4X5 TAN STRL (GAUZE/BANDAGES/DRESSINGS) ×4 IMPLANT
BNDG ESMARK 6X9 LF (GAUZE/BANDAGES/DRESSINGS)
BNDG GAUZE ELAST 4 BULKY (GAUZE/BANDAGES/DRESSINGS) ×4 IMPLANT
COVER SURGICAL LIGHT HANDLE (MISCELLANEOUS) ×4 IMPLANT
CUFF TOURNIQUET SINGLE 34IN LL (TOURNIQUET CUFF) IMPLANT
CUFF TOURNIQUET SINGLE 44IN (TOURNIQUET CUFF) IMPLANT
DRAPE C-ARM 42X72 X-RAY (DRAPES) IMPLANT
DRAPE INCISE IOBAN 66X45 STRL (DRAPES) ×4 IMPLANT
DRAPE OEC MINIVIEW 54X84 (DRAPES) ×4 IMPLANT
DRAPE ORTHO SPLIT 77X108 STRL (DRAPES)
DRAPE SURG ORHT 6 SPLT 77X108 (DRAPES) IMPLANT
DRAPE U-SHAPE 47X51 STRL (DRAPES) ×4 IMPLANT
DRESSING PREVENA PLUS CUSTOM (GAUZE/BANDAGES/DRESSINGS) ×2 IMPLANT
DRILL BIT 2.8MM (BIT) ×2
DRSG ADAPTIC 3X8 NADH LF (GAUZE/BANDAGES/DRESSINGS) ×4 IMPLANT
DRSG EMULSION OIL 3X3 NADH (GAUZE/BANDAGES/DRESSINGS) ×4 IMPLANT
DRSG PAD ABDOMINAL 8X10 ST (GAUZE/BANDAGES/DRESSINGS) ×4 IMPLANT
DRSG PREVENA PLUS CUSTOM (GAUZE/BANDAGES/DRESSINGS) ×4
DURAPREP 26ML APPLICATOR (WOUND CARE) ×8 IMPLANT
ELECT REM PT RETURN 9FT ADLT (ELECTROSURGICAL) ×4
ELECTRODE REM PT RTRN 9FT ADLT (ELECTROSURGICAL) ×2 IMPLANT
GAUZE SPONGE 4X4 12PLY STRL (GAUZE/BANDAGES/DRESSINGS) ×4 IMPLANT
GLOVE BIOGEL PI IND STRL 9 (GLOVE) ×2 IMPLANT
GLOVE BIOGEL PI INDICATOR 9 (GLOVE) ×2
GLOVE SURG ORTHO 9.0 STRL STRW (GLOVE) ×4 IMPLANT
GOWN STRL REUS W/ TWL XL LVL3 (GOWN DISPOSABLE) ×6 IMPLANT
GOWN STRL REUS W/TWL XL LVL3 (GOWN DISPOSABLE) ×6
KIT BASIN OR (CUSTOM PROCEDURE TRAY) ×4 IMPLANT
KIT ROOM TURNOVER OR (KITS) ×4 IMPLANT
MANIFOLD NEPTUNE II (INSTRUMENTS) IMPLANT
NS IRRIG 1000ML POUR BTL (IV SOLUTION) ×4 IMPLANT
PACK ORTHO EXTREMITY (CUSTOM PROCEDURE TRAY) ×4 IMPLANT
PAD ARMBOARD 7.5X6 YLW CONV (MISCELLANEOUS) ×8 IMPLANT
PLATE 7H LT ANTERIOR (Plate) ×4 IMPLANT
SCREW CORTEX 3.5 24MM (Screw) ×2 IMPLANT
SCREW CORTEX 3.5 26MM (Screw) ×2 IMPLANT
SCREW CORTEX 3.5 28MM (Screw) ×2 IMPLANT
SCREW CORTEX 3.5 40MM (Screw) ×6 IMPLANT
SCREW LOCK CORT ST 3.5X24 (Screw) ×2 IMPLANT
SCREW LOCK CORT ST 3.5X26 (Screw) ×2 IMPLANT
SCREW LOCK CORT ST 3.5X28 (Screw) ×2 IMPLANT
SCREW LOCK CORT ST 3.5X40 (Screw) ×6 IMPLANT
SCREW LOCK T15 FT 40X3.5XST (Screw) ×4 IMPLANT
SCREW LOCKING 3.5X40 (Screw) ×4 IMPLANT
SCREW LOCKING 3.5X45 (Screw) ×4 IMPLANT
SPONGE LAP 18X18 X RAY DECT (DISPOSABLE) IMPLANT
STAPLER VISISTAT 35W (STAPLE) IMPLANT
STOCKINETTE IMPERVIOUS 9X36 MD (GAUZE/BANDAGES/DRESSINGS) IMPLANT
SUCTION FRAZIER HANDLE 10FR (MISCELLANEOUS)
SUCTION TUBE FRAZIER 10FR DISP (MISCELLANEOUS) IMPLANT
SUT ETHILON 2 0 PSLX (SUTURE) ×8 IMPLANT
SUT VIC AB 0 CT1 27 (SUTURE)
SUT VIC AB 0 CT1 27XBRD ANBCTR (SUTURE) IMPLANT
SUT VIC AB 2-0 CT1 27 (SUTURE) ×2
SUT VIC AB 2-0 CT1 TAPERPNT 27 (SUTURE) ×2 IMPLANT
TOWEL OR 17X24 6PK STRL BLUE (TOWEL DISPOSABLE) IMPLANT
TOWEL OR 17X26 10 PK STRL BLUE (TOWEL DISPOSABLE) ×4 IMPLANT
TUBE CONNECTING 12'X1/4 (SUCTIONS)
TUBE CONNECTING 12X1/4 (SUCTIONS) IMPLANT
UNDERPAD 30X30 (UNDERPADS AND DIAPERS) ×4 IMPLANT
WATER STERILE IRR 1000ML POUR (IV SOLUTION) IMPLANT
WIRE COMPRESSION 2.8 200 40 (WIRE) ×8 IMPLANT
WND VAC CANISTER 500ML (MISCELLANEOUS) ×4 IMPLANT
YANKAUER SUCT BULB TIP NO VENT (SUCTIONS) ×4 IMPLANT

## 2016-10-17 NOTE — Progress Notes (Signed)
Orthopedic Tech Progress Note Patient Details:  Daryl Cook Sep 20, 1955 [id number]  Ortho Devices Type of Ortho Device: CAM walker Ortho Device/Splint Location: Patient decided he neened the cam walker Ortho Device/Splint Interventions: Application   [name] 10/17/2016, 6:21 PM

## 2016-10-17 NOTE — Anesthesia Postprocedure Evaluation (Signed)
Anesthesia Post Note  Patient: SUN WILENSKY  Procedure(s) Performed: Procedure(s) (LRB): REMOVE HARDWARE LEFT ANKLE (Left) VALGUS OSTEOTOMY, REVISION INTERNAL FIXATION LEFT ANKLE (Left)     Patient location during evaluation: PACU Anesthesia Type: General Level of consciousness: awake and alert Pain management: pain level controlled Vital Signs Assessment: post-procedure vital signs reviewed and stable Respiratory status: spontaneous breathing, nonlabored ventilation and respiratory function stable Cardiovascular status: blood pressure returned to baseline and stable Postop Assessment: no signs of nausea or vomiting Anesthetic complications: no    Last Vitals:  Vitals:   10/17/16 1500 10/17/16 1515  BP: (!) 143/80 (!) 144/82  Pulse: 66 70  Resp: 10 12  Temp:    SpO2: 98% 97%    Last Pain:  Vitals:   10/17/16 1426  TempSrc:   PainSc: 0-No pain                 Lynda Rainwater

## 2016-10-17 NOTE — Anesthesia Postprocedure Evaluation (Signed)
Anesthesia Post Note  Patient: Daryl Cook  Procedure(s) Performed: Procedure(s) (LRB): REMOVE HARDWARE LEFT ANKLE (Left) REVISION OPEN REDUCTION INTERNAL FIXATION (ORIF) DISTAL TIBIA FRACTURE VALGUS OSTEOTOMY (Left)     Anesthesia Type: General    Last Vitals:  Vitals:   10/17/16 1530 10/17/16 2100  BP: (!) 150/80 (!) 153/87  Pulse: 69 77  Resp: 15 16  Temp: 36.4 C 36.7 C  SpO2: 98% 97%    Last Pain:  Vitals:   10/17/16 2100  TempSrc: Oral  PainSc:                  Lynda Rainwater

## 2016-10-17 NOTE — Anesthesia Preprocedure Evaluation (Addendum)
Anesthesia Evaluation  Patient identified by MRN, date of birth, ID band Patient awake    Reviewed: Allergy & Precautions, H&P , NPO status , Patient's Chart, lab work & pertinent test results  Airway Mallampati: II  TM Distance: >3 FB Neck ROM: Full    Dental no notable dental hx. (+) Teeth Intact, Dental Advisory Given   Pulmonary former smoker,    Pulmonary exam normal breath sounds clear to auscultation       Cardiovascular hypertension,  Rhythm:Regular Rate:Normal     Neuro/Psych negative neurological ROS  negative psych ROS   GI/Hepatic GERD  Medicated and Controlled,(+) Hepatitis - (s/p harvoni), C  Endo/Other  negative endocrine ROS  Renal/GU negative Renal ROS  negative genitourinary   Musculoskeletal negative musculoskeletal ROS (+)   Abdominal   Peds  Hematology negative hematology ROS (+)   Anesthesia Other Findings Day of surgery medications reviewed with the patient.  Reproductive/Obstetrics negative OB ROS                            Anesthesia Physical  Anesthesia Plan  ASA: II  Anesthesia Plan: General   Post-op Pain Management:  Regional for Post-op pain   Induction: Intravenous  PONV Risk Score and Plan: 2 and Ondansetron and Dexamethasone  Airway Management Planned: LMA  Additional Equipment:   Intra-op Plan:   Post-operative Plan: Extubation in OR  Informed Consent: I have reviewed the patients History and Physical, chart, labs and discussed the procedure including the risks, benefits and alternatives for the proposed anesthesia with the patient or authorized representative who has indicated his/her understanding and acceptance.   Dental advisory given  Plan Discussed with: CRNA  Anesthesia Plan Comments:        Anesthesia Quick Evaluation

## 2016-10-17 NOTE — Anesthesia Procedure Notes (Signed)
Procedure Name: LMA Insertion Date/Time: 10/17/2016 1:03 PM Performed by: Oletta Lamas Pre-anesthesia Checklist: Patient identified, Emergency Drugs available, Suction available and Patient being monitored Patient Re-evaluated:Patient Re-evaluated prior to induction Oxygen Delivery Method: Circle System Utilized Preoxygenation: Pre-oxygenation with 100% oxygen Induction Type: IV induction Ventilation: Mask ventilation without difficulty LMA: LMA inserted LMA Size: 4.0 Number of attempts: 1 Placement Confirmation: positive ETCO2 Tube secured with: Tape Dental Injury: Teeth and Oropharynx as per pre-operative assessment

## 2016-10-17 NOTE — Progress Notes (Signed)
Orthopedic Tech Progress Note Patient Details:  Daryl Cook 19-Feb-1956 [id number]  Ortho Devices Type of Ortho Device: Postop shoe/boot Ortho Device/Splint Location: PT has cam walker   [name] 10/17/2016, 6:15 PM

## 2016-10-17 NOTE — Op Note (Signed)
10/17/2016  2:19 PM  PATIENT:  Daryl Cook    PRE-OPERATIVE DIAGNOSIS:  Hardware Failure Left Ankle  POST-OPERATIVE DIAGNOSIS:  Same  PROCEDURE:  REMOVE HARDWARE LEFT ANKLE, VALGUS OSTEOTOMY, REVISION INTERNAL FIXATION LEFT ANKLE PILON fracture  SURGEON:  Newt Minion, MD  PHYSICIAN ASSISTANT:None ANESTHESIA:   General  PREOPERATIVE INDICATIONS:  Daryl Cook is a  61 y.o. male with a diagnosis of Hardware Failure Left Ankle who failed conservative measures and elected for surgical management.    The risks benefits and alternatives were discussed with the patient preoperatively including but not limited to the risks of infection, bleeding, nerve injury, cardiopulmonary complications, the need for revision surgery, among others, and the patient was willing to proceed.  OPERATIVE IMPLANTS: 3.5 anterior lateral Synthes plate  OPERATIVE FINDINGS: Fibrous union at the fracture site with varus collapse  OPERATIVE PROCEDURE: Patient was brought to the operating room and underwent a general anesthetic. After adequate levels anesthesia attending patient's left lower extremity was prepped using DuraPrep and draped into a sterile field a timeout was called. The previous anterolateral incision was used and this was carried down to the superficial peroneal nerve. This was dissected out along the course of the nerve in the operative field and protected. Patient had extreme amount of calloused bone overlying the plate. This was removed with an osteotome. The maximal and distal screws from the plate were removed and the 2 pieces of the plate were removed. Patient had a fibrous union at the fracture site with varus collapse. A lateral wedge was removed from the tibia at the site of the fracture site using a ball-tipped guidewire these were inserted proximal and distal to the valgus closing osteotomy and the osteotomy was closed with good on good bone apposition with no extension or flexion at the  reduction site. A thicker Synthes plate was applied and this was compressed proximally with 3.5 cortical screws. The distal screws were then placed while the plate was a construction this allowed for a large moment arm to allow for stable. C-arm fluoroscopy was utilized throughout the case to verify alignment. The wound was irrigated with normal saline incision was closed using 2-0 nylon a incisional wound VAC was applied patient was extubated taken to the PACU in stable condition. fixation.

## 2016-10-17 NOTE — Progress Notes (Signed)
Plan for discharge to home on Saturday and nonweightbearing left lower extremity discharge orders written prescription for Percocet patient to be discharged with his Prevena wound VAC

## 2016-10-17 NOTE — Progress Notes (Addendum)
Versed 2mg  and Fentanyl 71mcg given as ordered per Dr Gifford Shave

## 2016-10-17 NOTE — Transfer of Care (Signed)
Immediate Anesthesia Transfer of Care Note  Patient: Daryl Cook  Procedure(s) Performed: Procedure(s): REMOVE HARDWARE LEFT ANKLE (Left) VALGUS OSTEOTOMY, REVISION INTERNAL FIXATION LEFT ANKLE (Left)  Patient Location: PACU  Anesthesia Type:General  Level of Consciousness: awake, oriented, drowsy and patient cooperative  Airway & Oxygen Therapy: Patient Spontanous Breathing and Patient connected to nasal cannula oxygen  Post-op Assessment: Report given to RN and Post -op Vital signs reviewed and stable  Post vital signs: Reviewed and stable  Last Vitals:  Vitals:   10/17/16 1515 10/17/16 1530  BP: (!) 144/82 (!) 150/80  Pulse: 70 69  Resp: 12 15  Temp:  36.4 C  SpO2: 97% 98%    Last Pain:  Vitals:   10/17/16 1530  TempSrc:   PainSc: 0-No pain      Patients Stated Pain Goal: 3 (33/58/25 1898)  Complications: No apparent anesthesia complications

## 2016-10-17 NOTE — Anesthesia Procedure Notes (Signed)
Anesthesia Regional Block: Popliteal block   Pre-Anesthetic Checklist: ,, timeout performed, Correct Patient, Correct Site, Correct Laterality, Correct Procedure, Correct Position, site marked, Risks and benefits discussed,  Surgical consent,  Pre-op evaluation,  At surgeon's request and post-op pain management  Laterality: Left  Prep: chloraprep       Needles:  Injection technique: Single-shot  Needle Type: Echogenic Needle     Needle Length: 9cm  Needle Gauge: 21     Additional Needles:   Procedures: ultrasound guided,,,,,,,,  Narrative:  Start time: 10/17/2016 12:40 PM End time: 10/17/2016 12:46 PM Injection made incrementally with aspirations every 5 mL.  Performed by: Personally  Anesthesiologist: Catalina Gravel  Additional Notes: No pain on injection. No increased resistance to injection. Injection made in 5cc increments.  Good needle visualization.  Patient tolerated procedure well.  Combined left popliteal/saphenous nerve block.
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
## 2016-10-18 DIAGNOSIS — T8489XA Other specified complication of internal orthopedic prosthetic devices, implants and grafts, initial encounter: Secondary | ICD-10-CM | POA: Diagnosis not present
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
## 2016-10-18 NOTE — Progress Notes (Signed)
Patient is stable and ready for discharge.  Switched over to prevena wound vac. Reviewed discharge instructions/medications with patient and patient's wife. Answered patient's questions.

## 2016-10-18 NOTE — Care Management Note (Signed)
Case Management Note  Patient Details  Name: Daryl Cook MRN: [id number] Date of Birth: 05-Mar-1955  Subjective/Objective:    Pt is s/p ORIF pilon fib fracture                Action/Plan:   PTA independent from home with wife.  Pt is to discharge home today.  CM spoke with pt - pt states he already has all the needed equipment including crutches, shower chair, bedside commode and wheelchair. Pt declined [place] for mobility issues.   Pt previously purchased PREVENA vac and wife brought machine into the hospital this am and Dr Sharol Given demonstrated to wife how to apply.  Per bedside nurse pt will keep vac on until scheduled appt with surgery and at that time it will be removed during the office visit.   No CM needs determined at the time this note was written.  If new needs arise please contact CM department   Expected Discharge Date:  10/18/16               Expected Discharge Plan:  Home/Self Care  In-House Referral:     Discharge planning Services  CM Consult  Post Acute Care Choice:    Choice offered to:     DME Arranged:    DME Agency:     HH Arranged:    HH Agency:     Status of Service:  Completed, signed off  If discussed at [employer] of Stay Meetings, dates discussed:    Additional Comments:  Maryclare Labrador, RN 10/18/2016, 9:17 AM

## 2016-10-18 NOTE — Plan of Care (Signed)
Problem: Pain Managment: Goal: General experience of comfort will improve Patient voices understanding of pain scale and calls for medication when needed.   

## 2016-10-20 NOTE — Telephone Encounter (Signed)
Surgery done 10/17/16

## 2016-10-22 ENCOUNTER — Encounter (HOSPITAL_COMMUNITY): Payer: Self-pay | Admitting: Orthopedic Surgery

## 2016-10-23 ENCOUNTER — Encounter (INDEPENDENT_AMBULATORY_CARE_PROVIDER_SITE_OTHER): Payer: Self-pay | Admitting: Family

## 2016-10-23 ENCOUNTER — Telehealth (INDEPENDENT_AMBULATORY_CARE_PROVIDER_SITE_OTHER): Payer: Self-pay | Admitting: Orthopedic Surgery

## 2016-10-23 ENCOUNTER — Ambulatory Visit (INDEPENDENT_AMBULATORY_CARE_PROVIDER_SITE_OTHER): Payer: BLUE CROSS/BLUE SHIELD | Admitting: Family

## 2016-10-23 DIAGNOSIS — S82872S Displaced pilon fracture of left tibia, sequela: Secondary | ICD-10-CM

## 2016-10-23 NOTE — Telephone Encounter (Signed)
Patient called advised the incision management system will run out tomorrow. Patient want to know what to do. The number to contact patient is [phone].

## 2016-10-23 NOTE — Telephone Encounter (Signed)
Called pt and he has a wound vac on. This should be removed with in 7 days. The pt will come in today to have this removed. appt at 10:15 today
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
## 2016-10-27 ENCOUNTER — Ambulatory Visit (INDEPENDENT_AMBULATORY_CARE_PROVIDER_SITE_OTHER): Payer: BLUE CROSS/BLUE SHIELD | Admitting: Orthopedic Surgery

## 2016-10-30 ENCOUNTER — Ambulatory Visit (INDEPENDENT_AMBULATORY_CARE_PROVIDER_SITE_OTHER): Payer: BLUE CROSS/BLUE SHIELD | Admitting: Family

## 2016-10-30 ENCOUNTER — Ambulatory Visit (INDEPENDENT_AMBULATORY_CARE_PROVIDER_SITE_OTHER): Payer: BLUE CROSS/BLUE SHIELD

## 2016-10-30 DIAGNOSIS — M25572 Pain in left ankle and joints of left foot: Secondary | ICD-10-CM

## 2016-10-30 MED ORDER — OXYCODONE-ACETAMINOPHEN 10-325 MG PO TABS: 1.0000 | ORAL_TABLET | Freq: Four times a day (QID) | ORAL | 0 refills | Status: DC | PRN
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
## 2016-11-13 ENCOUNTER — Ambulatory Visit (INDEPENDENT_AMBULATORY_CARE_PROVIDER_SITE_OTHER): Payer: BLUE CROSS/BLUE SHIELD | Admitting: Family

## 2016-11-13 DIAGNOSIS — S82872S Displaced pilon fracture of left tibia, sequela: Secondary | ICD-10-CM

## 2016-11-13 MED ORDER — OXYCODONE-ACETAMINOPHEN 10-325 MG PO TABS: 1.0000 | ORAL_TABLET | Freq: Two times a day (BID) | ORAL | 0 refills | Status: DC | PRN
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
## 2016-11-26 ENCOUNTER — Telehealth: Payer: Self-pay

## 2016-11-26 ENCOUNTER — Ambulatory Visit (INDEPENDENT_AMBULATORY_CARE_PROVIDER_SITE_OTHER): Payer: BLUE CROSS/BLUE SHIELD | Admitting: Podiatry

## 2016-11-26 ENCOUNTER — Encounter: Payer: Self-pay | Admitting: Podiatry

## 2016-11-26 DIAGNOSIS — L03032 Cellulitis of left toe: Secondary | ICD-10-CM

## 2016-11-26 DIAGNOSIS — L603 Nail dystrophy: Secondary | ICD-10-CM | POA: Diagnosis not present

## 2016-11-26 MED ORDER — TERBINAFINE HCL 250 MG PO TABS: 250.0000 mg | ORAL_TABLET | Freq: Every day | ORAL | 0 refills | Status: DC

## 2016-11-26 MED ORDER — CEPHALEXIN 500 MG PO CAPS: 500.0000 mg | ORAL_CAPSULE | Freq: Three times a day (TID) | ORAL | 1 refills | Status: DC
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
## 2016-11-26 NOTE — Telephone Encounter (Signed)
Pharmacy called stating that the lamisil Rx written for patient is contraindicated due to his diagnosis of Hep C. Last hep fx panel was 3 months ago AST20 ALT 14. Advised pharmacist to put medication on hold until this is discussed with the doctor

## 2016-11-26 NOTE — Telephone Encounter (Signed)
He is clear of Hep C.  Has taken medication to cure liver of Hep C.  Enzymes are good and she is only going to be on it for a month.

## 2016-11-26 NOTE — Telephone Encounter (Signed)
Pharmacy notified or orders to fill

## 2016-12-04 ENCOUNTER — Ambulatory Visit (INDEPENDENT_AMBULATORY_CARE_PROVIDER_SITE_OTHER): Payer: BLUE CROSS/BLUE SHIELD

## 2016-12-04 ENCOUNTER — Ambulatory Visit (INDEPENDENT_AMBULATORY_CARE_PROVIDER_SITE_OTHER): Payer: BLUE CROSS/BLUE SHIELD | Admitting: Orthopedic Surgery

## 2016-12-04 ENCOUNTER — Encounter (INDEPENDENT_AMBULATORY_CARE_PROVIDER_SITE_OTHER): Payer: Self-pay | Admitting: Orthopedic Surgery

## 2016-12-04 DIAGNOSIS — S82872S Displaced pilon fracture of left tibia, sequela: Secondary | ICD-10-CM
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
## 2016-12-10 ENCOUNTER — Ambulatory Visit (INDEPENDENT_AMBULATORY_CARE_PROVIDER_SITE_OTHER): Payer: BLUE CROSS/BLUE SHIELD | Admitting: Internal Medicine

## 2016-12-10 ENCOUNTER — Encounter: Payer: Self-pay | Admitting: Internal Medicine

## 2016-12-10 VITALS — BP 132/84 | HR 80 | Temp 98.0°F | Wt 170.0 lb

## 2016-12-10 DIAGNOSIS — G2581 Restless legs syndrome: Secondary | ICD-10-CM

## 2016-12-10 DIAGNOSIS — Z23 Encounter for immunization: Secondary | ICD-10-CM

## 2016-12-10 MED ORDER — GABAPENTIN 300 MG PO CAPS: 300.0000 mg | ORAL_CAPSULE | Freq: Every day | ORAL | 5 refills | Status: DC
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
## 2016-12-10 NOTE — Addendum Note (Signed)
Addended by: Pilar Grammes on: 12/10/2016 04:55 PM   Modules accepted: Orders
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
## 2016-12-10 NOTE — Patient Instructions (Signed)
Try the gabapentin--1 tonight and if it doesn't help and no problems, go up to 2. You can increase to 3 capsules in 3-4 days if it isn't working. If that doesn't work after several days, let me know and I will change to a different medication.

## 2016-12-16 ENCOUNTER — Telehealth: Payer: Self-pay | Admitting: Internal Medicine

## 2016-12-16 NOTE — Telephone Encounter (Signed)
Copied from [place] #[id number]. Topic: Quick Communication - See Telephone Encounter >> Dec 16, 2016  2:07 PM Ali Lowe E wrote: Pt called about a medication for restless leg syndrom that Dr. Silvio Pate had prescribed him last week.  He said that it helped some but didn't resolve all the symptoms.  He wondered if there was something else that could be called in.  [facility] [place]

## 2016-12-17 NOTE — Telephone Encounter (Signed)
Spoke to pt who states he was originally taking 600mg  of gabapentin, but increased to 900mg  on Monday 10/15. He was unable to sleep the first night of increasing med, but states he slept well last night, and did not awaken at all.

## 2016-12-17 NOTE — Telephone Encounter (Signed)
Spoke to pt and advised. States he will update office with any changes. Med list updated

## 2016-12-17 NOTE — Telephone Encounter (Signed)
Okay for him to continue the 900mg  nightly Change his med list and we can send refill at the new dose when he needs it

## 2016-12-17 NOTE — Telephone Encounter (Signed)
Find out how much he is taking and if he is having any side effects (like cloudy mentation in AM). If he is taking 600mg  and it is helping, but not enough, have him increase to 900mg  at bedtime. If it is helping some, we should push the dose before trying something else

## 2016-12-29 ENCOUNTER — Telehealth: Payer: Self-pay | Admitting: Internal Medicine

## 2016-12-29 ENCOUNTER — Telehealth: Payer: Self-pay | Admitting: Podiatry

## 2016-12-29 ENCOUNTER — Encounter: Payer: Self-pay | Admitting: Podiatry

## 2016-12-29 ENCOUNTER — Ambulatory Visit (INDEPENDENT_AMBULATORY_CARE_PROVIDER_SITE_OTHER): Payer: BLUE CROSS/BLUE SHIELD | Admitting: Podiatry

## 2016-12-29 DIAGNOSIS — L603 Nail dystrophy: Secondary | ICD-10-CM

## 2016-12-29 DIAGNOSIS — Z79899 Other long term (current) drug therapy: Secondary | ICD-10-CM | POA: Diagnosis not present

## 2016-12-29 DIAGNOSIS — G2581 Restless legs syndrome: Secondary | ICD-10-CM

## 2016-12-29 MED ORDER — GABAPENTIN 300 MG PO CAPS: ORAL_CAPSULE | ORAL | 5 refills | Status: DC

## 2016-12-29 MED ORDER — TERBINAFINE HCL 250 MG PO TABS: 250.0000 mg | ORAL_TABLET | Freq: Every day | ORAL | 0 refills | Status: DC

## 2016-12-29 NOTE — Telephone Encounter (Signed)
Copied from [place]. Topic: Quick Communication - See Telephone Encounter >> Dec 29, 2016 11:22 AM Ether Griffins B wrote: CRM for notification. See Telephone encounter for:  12/29/16. Pt contacted pharmacy for refill on gabapentin and was told its not time for refill but Dr. Silvio Pate increased meds and he will be out after taking it tonight. Needs to pharmacy contacted to know its ok to refill meds.

## 2016-12-29 NOTE — Progress Notes (Signed)
He presents today states that is completed 30 days of Lamisil. He states the toe looks so much better and the skin appears to be normal.  Objective: At this point I gave him the opportunity to either remove the nail in total 4 to stay on the medication and see if it is unclear the remainder of the nails. He would like to try to stay on the medication. He does have a history of hep C which has been completely eradicated from his body for several years. He would like to have blood work done to make sure that his liver is not being stressed.  Objective: Onychomycosis tinea pedis resolving.  Plan: I'm going to allow him to stay on the Lamisil provided we do liver profiles. Should his liver profile the abnormal or not demonstrate similar findings from the June profile we will follow-up with him and recommend that we discontinue the medication and just remove the nail itself. He is amenable to this currently we will follow-up with him in 4 months

## 2016-12-29 NOTE — Telephone Encounter (Signed)
This is [facility] [place]. We got a flag for a conflict with the terbinafine that was prescribed. Looks like we had a report of chronic viral Hepatitis C. We wanted to see if Dr. Milinda Pointer was aware of this. Please call us back at [phone]. Thank you.

## 2016-12-30 ENCOUNTER — Telehealth: Payer: Self-pay | Admitting: *Deleted

## 2016-12-30 LAB — HEPATIC FUNCTION PANEL
ALBUMIN: 4.5 g/dL (ref 3.6–4.8)
ALK PHOS: 100 IU/L (ref 39–117)
ALT: 12 IU/L (ref 0–44)
AST: 16 IU/L (ref 0–40)
BILIRUBIN TOTAL: 0.3 mg/dL (ref 0.0–1.2)
BILIRUBIN, DIRECT: 0.08 mg/dL (ref 0.00–0.40)
Total Protein: 7.4 g/dL (ref 6.0–8.5)

## 2016-12-30 NOTE — Telephone Encounter (Addendum)
-----   Message from Garrel Ridgel, [place] sent at 12/30/2016  8:00 AM EDT ----- Blood work looks good and may continue with medication. I informed pt of Dr. Stephenie Acres review of results and orders. Pt states pharmacy won't fill until they know the labs have been performed. I informed [place] [id number] pt's labs had been completed and he could receive the terbinafine.

## 2017-01-01 ENCOUNTER — Encounter (INDEPENDENT_AMBULATORY_CARE_PROVIDER_SITE_OTHER): Payer: Self-pay | Admitting: Orthopedic Surgery

## 2017-01-01 ENCOUNTER — Ambulatory Visit (INDEPENDENT_AMBULATORY_CARE_PROVIDER_SITE_OTHER): Payer: BLUE CROSS/BLUE SHIELD

## 2017-01-01 ENCOUNTER — Ambulatory Visit (INDEPENDENT_AMBULATORY_CARE_PROVIDER_SITE_OTHER): Payer: BLUE CROSS/BLUE SHIELD | Admitting: Orthopedic Surgery

## 2017-01-01 DIAGNOSIS — S82872S Displaced pilon fracture of left tibia, sequela: Secondary | ICD-10-CM
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
## 2017-01-29 ENCOUNTER — Ambulatory Visit (INDEPENDENT_AMBULATORY_CARE_PROVIDER_SITE_OTHER): Payer: BLUE CROSS/BLUE SHIELD | Admitting: Orthopedic Surgery

## 2017-01-29 ENCOUNTER — Ambulatory Visit (INDEPENDENT_AMBULATORY_CARE_PROVIDER_SITE_OTHER): Payer: BLUE CROSS/BLUE SHIELD

## 2017-01-29 ENCOUNTER — Encounter (INDEPENDENT_AMBULATORY_CARE_PROVIDER_SITE_OTHER): Payer: Self-pay | Admitting: Orthopedic Surgery

## 2017-01-29 DIAGNOSIS — S82872S Displaced pilon fracture of left tibia, sequela: Secondary | ICD-10-CM
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
## 2017-02-03 ENCOUNTER — Other Ambulatory Visit: Payer: Self-pay | Admitting: Internal Medicine

## 2017-02-03 NOTE — Telephone Encounter (Signed)
No longer on his med list. Will get approval from Dr Silvio Pate

## 2017-02-03 NOTE — Telephone Encounter (Signed)
Approved: okay #[id number] x 2

## 2017-05-04 ENCOUNTER — Ambulatory Visit: Payer: BLUE CROSS/BLUE SHIELD | Admitting: Podiatry

## 2017-05-13 ENCOUNTER — Ambulatory Visit: Payer: BLUE CROSS/BLUE SHIELD | Admitting: Podiatry

## 2017-05-13 ENCOUNTER — Encounter: Payer: BLUE CROSS/BLUE SHIELD | Admitting: Internal Medicine

## 2017-05-13 ENCOUNTER — Encounter: Payer: Self-pay | Admitting: Podiatry

## 2017-05-13 DIAGNOSIS — L603 Nail dystrophy: Secondary | ICD-10-CM | POA: Diagnosis not present

## 2017-05-13 MED ORDER — TERBINAFINE HCL 250 MG PO TABS: 250.0000 mg | ORAL_TABLET | Freq: Every day | ORAL | 0 refills | Status: DC

## 2017-05-13 NOTE — Patient Instructions (Signed)
Dr. Hyatt has sent over a refill for Lamisil to your pharmacy today. The instructions on your bottle will say "take 1 tablet daily", however, he would like for you to take one pill every other day. He will follow up with you in 3 months to re-evaluate your toenails. 

## 2017-05-13 NOTE — Progress Notes (Signed)
Daryl Cook presents today for follow-up of nail fungus states that he is taking 120 days with the medicine and they look great is very happy with the outcome thus far.  Objective: Vital signs are stable alert and oriented x3.  Nails appear to be 90% clear.  Assessment: Resolving tinea pedis and onychomycosis.  Plan: We will request an every other day dose of Lamisil follow-up with him in 3 months.

## 2017-05-27 ENCOUNTER — Ambulatory Visit (INDEPENDENT_AMBULATORY_CARE_PROVIDER_SITE_OTHER): Payer: BLUE CROSS/BLUE SHIELD | Admitting: Internal Medicine

## 2017-05-27 ENCOUNTER — Encounter: Payer: Self-pay | Admitting: Gastroenterology

## 2017-05-27 ENCOUNTER — Encounter: Payer: Self-pay | Admitting: Internal Medicine

## 2017-05-27 VITALS — BP 126/80 | HR 66 | Temp 97.4°F | Ht 67.0 in | Wt 164.0 lb

## 2017-05-27 DIAGNOSIS — Z0000 Encounter for general adult medical examination without abnormal findings: Secondary | ICD-10-CM | POA: Diagnosis not present

## 2017-05-27 DIAGNOSIS — Z8619 Personal history of other infectious and parasitic diseases: Secondary | ICD-10-CM | POA: Diagnosis not present

## 2017-05-27 DIAGNOSIS — G2581 Restless legs syndrome: Secondary | ICD-10-CM

## 2017-05-27 DIAGNOSIS — Z1211 Encounter for screening for malignant neoplasm of colon: Secondary | ICD-10-CM

## 2017-05-27 DIAGNOSIS — K219 Gastro-esophageal reflux disease without esophagitis: Secondary | ICD-10-CM | POA: Diagnosis not present

## 2017-05-27 MED ORDER — GABAPENTIN 300 MG PO CAPS: ORAL_CAPSULE | ORAL | 3 refills | Status: DC

## 2017-05-27 MED ORDER — SILDENAFIL CITRATE 20 MG PO TABS: 60.0000 mg | ORAL_TABLET | Freq: Every day | ORAL | 11 refills | Status: DC | PRN

## 2017-05-27 NOTE — Assessment & Plan Note (Signed)
Sleeps well with the gabapentin---discussed trying to wean as tolerated

## 2017-05-27 NOTE — Assessment & Plan Note (Signed)
Recent LFTs normal

## 2017-05-27 NOTE — Assessment & Plan Note (Signed)
Generally healthy Discussed exercise Yearly flu vaccine Due for colon--will set up Defer PSA till at least next year
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
## 2017-05-27 NOTE — Assessment & Plan Note (Signed)
Okay on PPI

## 2017-07-29 ENCOUNTER — Other Ambulatory Visit: Payer: Self-pay

## 2017-07-29 ENCOUNTER — Ambulatory Visit (AMBULATORY_SURGERY_CENTER): Payer: Self-pay

## 2017-07-29 VITALS — Ht 68.0 in | Wt 160.4 lb

## 2017-07-29 DIAGNOSIS — Z1211 Encounter for screening for malignant neoplasm of colon: Secondary | ICD-10-CM

## 2017-07-29 MED ORDER — PEG-KCL-NACL-NASULF-NA ASC-C 140 G PO SOLR: 1.0000 | Freq: Once | ORAL | 0 refills | Status: AC

## 2017-07-29 NOTE — Progress Notes (Signed)
Denies allergies to eggs or soy products. Denies complication of anesthesia or sedation. Denies use of weight loss medication. Denies use of O2.   Emmi instructions declined.  

## 2017-08-04 ENCOUNTER — Telehealth: Payer: Self-pay
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
## 2017-08-04 NOTE — Telephone Encounter (Signed)
Copied from [place] [id number]. Topic: Inquiry >> Aug 04, 2017  2:19 PM Pricilla Handler wrote: Reason for CRM: Patient called wanting to know if Dr. Silvio Pate would send a medication for his allergies to the patient's preferred pharmacy: [id number] [address], [place] [phone] (Phone) [phone] (Fax). Patient would like the medication sent without having to come in to be seen.Marland KitchenMarland Kitchen

## 2017-08-05 NOTE — Telephone Encounter (Signed)
Spoke to pt. He will try the combination of the 3 meds.

## 2017-08-05 NOTE — Telephone Encounter (Signed)
Not sure he needs prescription.  I would start with daily cetirizine 10mg  and add loratadine 10mg  --2 tabs daily &/or fexofenadine 180mg  daily. If he had allergic asthma, there is a prescription medication--but for just regular allergy symptoms, the OTC are best

## 2017-08-06 ENCOUNTER — Encounter: Payer: Self-pay | Admitting: Gastroenterology

## 2017-08-12 ENCOUNTER — Encounter: Payer: Self-pay | Admitting: Gastroenterology

## 2017-08-12 ENCOUNTER — Ambulatory Visit (AMBULATORY_SURGERY_CENTER): Payer: BLUE CROSS/BLUE SHIELD | Admitting: Gastroenterology

## 2017-08-12 ENCOUNTER — Other Ambulatory Visit: Payer: Self-pay

## 2017-08-12 VITALS — BP 146/94 | HR 75 | Temp 97.8°F | Resp 16 | Ht 67.0 in | Wt 164.0 lb

## 2017-08-12 DIAGNOSIS — D123 Benign neoplasm of transverse colon: Secondary | ICD-10-CM

## 2017-08-12 DIAGNOSIS — Z1211 Encounter for screening for malignant neoplasm of colon: Secondary | ICD-10-CM

## 2017-08-12 MED ORDER — SODIUM CHLORIDE 0.9 % IV SOLN: 500.0000 mL | Freq: Once | INTRAVENOUS | Status: DC

## 2017-08-12 NOTE — Op Note (Signed)
[facility] Patient Name: Daryl Cook Procedure Date: 08/12/2017 9:37 AM MRN: [id number] Endoscopist: Mallie Mussel L. Loletha Carrow , MD Age: 62 Referring MD:  Date of Birth: [birth year] Gender: Male Account #: [id number] Procedure:                Colonoscopy Indications:              Screening for colorectal malignant neoplasm Medicines:                Monitored Anesthesia Care Procedure:                Pre-Anesthesia Assessment:                           - Prior to the procedure, a History and Physical                            was performed, and patient medications and                            allergies were reviewed. The patient's tolerance of                            previous anesthesia was also reviewed. The risks                            and benefits of the procedure and the sedation                            options and risks were discussed with the patient.                            All questions were answered, and informed consent                            was obtained. Prior Anticoagulants: The patient has                            taken no previous anticoagulant or antiplatelet                            agents. ASA Grade Assessment: II - A patient with                            mild systemic disease. After reviewing the risks                            and benefits, the patient was deemed in                            satisfactory condition to undergo the procedure.                           After obtaining informed consent, the colonoscope  was passed under direct vision. Throughout the                            procedure, the patient's blood pressure, pulse, and                            oxygen saturations were monitored continuously. The                            Colonoscope was introduced through the anus and                            advanced to the the cecum, identified by                            appendiceal orifice and  ileocecal valve. The                            colonoscopy was performed without difficulty. The                            patient tolerated the procedure well. The quality                            of the bowel preparation was excellent. The                            ileocecal valve, appendiceal orifice, and rectum                            were photographed. Scope In: 9:46:35 AM Scope Out: 10:03:24 AM Scope Withdrawal Time: 0 hours 12 minutes 22 seconds  Total Procedure Duration: 0 hours 16 minutes 49 seconds  Findings:                 The perianal and digital rectal examinations were                            normal.                           Multiple diverticula were found from sigmoid to                            transverse colon.                           Two sessile polyps were found in the transverse                            colon. The polyps were 4 to 6 mm in size. These                            polyps were removed with a cold snare. Resection  and retrieval were complete.                           The exam was otherwise without abnormality on                            direct and retroflexion views. Complications:            No immediate complications. Estimated Blood Loss:     Estimated blood loss was minimal. Impression:               - Diverticulosis from sigmoid to transverse colon.                           - Two 4 to 6 mm polyps in the transverse colon,                            removed with a cold snare. Resected and retrieved.                           - The examination was otherwise normal on direct                            and retroflexion views. Recommendation:           - Patient has a contact number available for                            emergencies. The signs and symptoms of potential                            delayed complications were discussed with the                            patient. Return to normal activities  tomorrow.                            Written discharge instructions were provided to the                            patient.                           - Resume previous diet.                           - Continue present medications.                           - Await pathology results.                           - Repeat colonoscopy is recommended for                            surveillance. The colonoscopy date will be  determined after pathology results from today's                            exam become available for review.  L. Loletha Carrow, MD 08/12/2017 10:14:08 AM This report has been signed electronically.

## 2017-08-12 NOTE — Progress Notes (Signed)
Called to room to assist during endoscopic procedure.  Patient ID and intended procedure confirmed with present staff. Received instructions for my participation in the procedure from the performing physician.  

## 2017-08-12 NOTE — Progress Notes (Signed)
Report given to PACU, vss 

## 2017-08-12 NOTE — Patient Instructions (Signed)
YOU HAD AN ENDOSCOPIC PROCEDURE TODAY AT [facility]:   Refer to the procedure report that was given to you for any specific questions about what was found during the examination.  If the procedure report does not answer your questions, please call your gastroenterologist to clarify.  If you requested that your care partner not be given the details of your procedure findings, then the procedure report has been included in a sealed envelope for you to review at your convenience later.  YOU SHOULD EXPECT: Some feelings of bloating in the abdomen. Passage of more gas than usual.  Walking can help get rid of the air that was put into your GI tract during the procedure and reduce the bloating. If you had a lower endoscopy (such as a colonoscopy or flexible sigmoidoscopy) you may notice spotting of blood in your stool or on the toilet paper. If you underwent a bowel prep for your procedure, you may not have a normal bowel movement for a few days.  Please Note:  You might notice some irritation and congestion in your nose or some drainage.  This is from the oxygen used during your procedure.  There is no need for concern and it should clear up in a day or so.  SYMPTOMS TO REPORT IMMEDIATELY:   Following lower endoscopy (colonoscopy or flexible sigmoidoscopy):  Excessive amounts of blood in the stool  Significant tenderness or worsening of abdominal pains  Swelling of the abdomen that is new, acute  Fever of 100F or higher  PLease see handouts on Polyps and Diverticulosis.  For urgent or emergent issues, a gastroenterologist can be reached at any hour by calling [phone].   DIET:  We do recommend a small meal at first, but then you may proceed to your regular diet.  Drink plenty of fluids but you should avoid alcoholic beverages for 24 hours.  ACTIVITY:  You should plan to take it easy for the rest of today and you should NOT DRIVE or use heavy machinery until tomorrow (because  of the sedation medicines used during the test).    FOLLOW UP: Our staff will call the number listed on your records the next business day following your procedure to check on you and address any questions or concerns that you may have regarding the information given to you following your procedure. If we do not reach you, we will leave a message.  However, if you are feeling well and you are not experiencing any problems, there is no need to return our call.  We will assume that you have returned to your regular daily activities without incident.  If any biopsies were taken you will be contacted by phone or by letter within the next 1-3 weeks.  Please call us at [phone] if you have not heard about the biopsies in 3 weeks.    SIGNATURES/CONFIDENTIALITY: You and/or your care partner have signed paperwork which will be entered into your electronic medical record.  These signatures attest to the fact that that the information above on your After Visit Summary has been reviewed and is understood.  Full responsibility of the confidentiality of this discharge information lies with you and/or your care-partner.  Thank you for letting us take care of your healthcare needs today.

## 2017-08-12 NOTE — Progress Notes (Signed)
Pt's states no medical or surgical changes since previsit or office visit. 

## 2017-08-13 ENCOUNTER — Telehealth: Payer: Self-pay | Admitting: *Deleted

## 2017-08-13 NOTE — Telephone Encounter (Signed)
  Follow up Call-  Call back number 08/12/2017  Post procedure Call Back phone  # [phone]  Permission to leave phone message Yes  Some recent data might be hidden     Patient questions:  Do you have a fever, pain , or abdominal swelling? No. Pain Score  0 *  Have you tolerated food without any problems? Yes.    Have you been able to return to your normal activities? Yes.    Do you have any questions about your discharge instructions: Diet   No. Medications  No. Follow up visit  No.  Do you have questions or concerns about your Care? No.  Actions: * If pain score is 4 or above: No action needed, pain <4.

## 2017-08-19 ENCOUNTER — Encounter: Payer: Self-pay | Admitting: Gastroenterology

## 2017-08-24 ENCOUNTER — Ambulatory Visit: Payer: BLUE CROSS/BLUE SHIELD | Admitting: Podiatry

## 2018-01-20 ENCOUNTER — Encounter: Payer: Self-pay | Admitting: Podiatry

## 2018-01-20 ENCOUNTER — Ambulatory Visit: Payer: BLUE CROSS/BLUE SHIELD | Admitting: Podiatry

## 2018-01-20 DIAGNOSIS — L603 Nail dystrophy: Secondary | ICD-10-CM | POA: Diagnosis not present

## 2018-01-20 NOTE — Progress Notes (Signed)
He presents today after having not seen him since March with a concern that he may be redeveloping onychomycosis left foot.  Objective: Vital signs are stable alert and oriented x3.  Pulses are palpable.  Nails demonstrate a mild nail dystrophy but I see no signs of cutaneous or nail infection.  At this point it appears just to be some nail dystrophy.  Assessment: Nail dystrophy.  Plan: Follow-up with me as needed

## 2018-05-25 ENCOUNTER — Telehealth: Payer: Self-pay | Admitting: Internal Medicine

## 2018-05-25 NOTE — Telephone Encounter (Signed)
I left a message on patient's voice mail to call back and reschedule physical.

## 2018-05-31 ENCOUNTER — Encounter: Payer: BLUE CROSS/BLUE SHIELD | Admitting: Internal Medicine

## 2018-09-23 ENCOUNTER — Other Ambulatory Visit: Payer: Self-pay

## 2018-09-23 ENCOUNTER — Ambulatory Visit (INDEPENDENT_AMBULATORY_CARE_PROVIDER_SITE_OTHER): Payer: BC Managed Care – PPO | Admitting: Family Medicine

## 2018-09-23 VITALS — BP 124/62 | HR 99 | Temp 98.5°F | Resp 20 | Ht 67.0 in | Wt 150.5 lb

## 2018-09-23 DIAGNOSIS — L237 Allergic contact dermatitis due to plants, except food: Secondary | ICD-10-CM

## 2018-09-23 DIAGNOSIS — G2581 Restless legs syndrome: Secondary | ICD-10-CM

## 2018-09-23 MED ORDER — PREDNISONE 20 MG PO TABS: ORAL_TABLET | ORAL | 0 refills | Status: AC

## 2018-09-23 MED ORDER — GABAPENTIN 300 MG PO CAPS: ORAL_CAPSULE | ORAL | 3 refills | Status: DC

## 2018-09-23 MED ORDER — SILDENAFIL CITRATE 20 MG PO TABS: 60.0000 mg | ORAL_TABLET | Freq: Every day | ORAL | 11 refills | Status: DC | PRN

## 2018-09-23 NOTE — Telephone Encounter (Signed)
Patient requesting refill on Gabapentin and Sildenafil. Patient is seen Dr. Einar Pheasant today and will schedule CPE on his way out. Ok to fill? [facility] [address]

## 2018-09-23 NOTE — Patient Instructions (Addendum)
# call if rash returns after completion of prednisone - if any vision changes or worsening eye issues then go to the eye doctor    [facility] Dermatitis  Poison oak dermatitis is redness and soreness (inflammation) of the skin caused by chemicals in the leaves of the poison oak plant. You may have very bad itching, swelling, a rash, and blisters. What are the causes? You may get this condition by:  Touching a poison oak plant.  Touching something that has the chemical from the leaves on it. This may include animals or objects that have come in contact with the plant. What increases the risk? You are more likely to get this condition if you:  Go outdoors often in wooded or marshy areas.  Go outdoors without wearing protective clothing, such as closed shoes, long pants, and a long-sleeved shirt. What are the signs or symptoms? Symptoms of this condition include:  Redness of the skin.  Very bad itching.  A rash that often includes bumps and blisters. ? The rash usually appears 48 hours after exposure if you have been exposed before. ? If this is the first time you have been exposed, the rash may not appear until a week after exposure.  Swelling. This may occur if the reaction is very bad. Symptoms often clear up in 1-2 weeks. The first time you develop this condition, symptoms may last 3-4 weeks. How is this treated? This condition may be treated with:  Hydrocortisone creams or calamine lotions to help with itching.  Oatmeal baths to soothe the skin.  Medicines to help reduce itching (antihistamines). If you have a very bad reaction, you may also be given steroid medicines. Follow these instructions at home: Medicines  Take or apply over-the-counter and prescription medicines only as told by your doctor.  Use hydrocortisone creams or calamine lotion as needed to help with itching. General instructions  Do not scratch or rub your skin.  Put a cold, wet cloth (cold  compress) on the affected areas or take baths in cool water. This will help with itching.  Avoid hot baths and showers.  Take oatmeal baths as needed. Use colloidal oatmeal. You can get this at a pharmacy or grocery store. Follow the instructions on the package.  While you have the rash, wash your clothes right after you wear them.  Keep all follow-up visits as told by your doctor. This is important. How is this prevented?   Know what poison oak looks like so you can avoid it. ? This plant has three leaves with flowering branches on a single stem. ? The leaves are fuzzy. ? The edges of the leaves look like teeth.  If you have touched poison oak, wash your skin with soap and water right away. Be sure to wash under your fingernails.  When hiking or camping, wear long pants, a long-sleeved shirt, tall socks, and hiking boots. You can also use a lotion on your skin that helps to prevent contact with the chemical on the plant.  If you think that your clothes or outdoor gear came in contact with poison oak, rinse them off with a garden hose before you bring them inside your house.  When doing yard work or gardening, wear gloves, long sleeves, long pants, and boots. Wash your garden tools and gloves if they come in contact with poison oak.  If you think that your pet has come into contact with poison oak, wash him or her with pet shampoo and water. Make  sure you wear gloves while washing your pet.  Do not burn poison oak plants. This can release the chemical from the plant into the air and may cause a reaction. Contact a doctor if:  You have open sores in the rash area.  You have more redness, swelling, or pain in the affected area.  You have redness that spreads beyond the rash area.  You have fluid, blood, or pus coming from the affected area.  You have a fever.  You have a rash over a large area of your body.  You have a rash on your eyes, mouth, or genitals.  Your rash does  not improve after a few weeks. Get help right away if:  Your face swells or your eyes swell shut.  You have trouble breathing.  You have trouble swallowing. These symptoms may be an emergency. Do not wait to see if the symptoms will go away. Get medical help right away. Call your local emergency services (911 in the [place] Do not drive yourself to the hospital. Summary  Poison oak dermatitis is redness and soreness of the skin caused by chemicals in the leaves of the poison oak plant.  Symptoms of this condition include redness, very bad itching, a rash, and swelling.  Do not scratch or rub your skin.  Take or apply over-the-counter and prescription medicines only as told by your doctor. This information is not intended to replace advice given to you by your health care provider. Make sure you discuss any questions you have with your health care provider. Document Released: 03/22/2010 Document Revised: 06/11/2018 Document Reviewed: 03/19/2018 Elsevier Patient Education  2020 [employer].

## 2018-09-23 NOTE — Progress Notes (Signed)
   Subjective:     Daryl Cook is a 63 y.o. male presenting for Skin Problem (break out of rash on left leg, left arm and face. Noticed 3 days ago.)     HPI  #Skin rash - started 3 days ago - left leg, arm and now face - exposure: front yard - outdoors - plant around his tree - eyes are itchy - swelling under the eyes - Treatment: epson salt soak, calamine lotion    Review of Systems  Constitutional: Negative for chills and fever.  Eyes: Positive for itching. Negative for photophobia, pain, discharge and visual disturbance.     Social History   Tobacco Use  Smoking Status Former Smoker  . Types: Cigarettes  . Quit date: 04/12/2000  . Years since quitting: 18.4  Smokeless Tobacco Never Used        Objective:    BP Readings from Last 3 Encounters:  09/23/18 124/62  08/12/17 (!) 146/94  05/27/17 126/80   Wt Readings from Last 3 Encounters:  09/23/18 150 lb 8 oz (68.3 kg)  08/12/17 164 lb (74.4 kg)  07/29/17 160 lb 6.4 oz (72.8 kg)    BP 124/62   Pulse 99   Temp 98.5 F (36.9 C)   Resp 20   Ht 5\' 7"  (1.702 m)   Wt 150 lb 8 oz (68.3 kg)   BMI 23.57 kg/m    Physical Exam Constitutional:      Appearance: Normal appearance. He is not ill-appearing or diaphoretic.  HENT:     Right Ear: External ear normal.     Left Ear: External ear normal.     Nose: Nose normal.  Eyes:     General: No scleral icterus.       Right eye: No discharge.        Left eye: No discharge.     Extraocular Movements: Extraocular movements intact.     Conjunctiva/sclera: Conjunctivae normal.     Pupils: Pupils are equal, round, and reactive to light.  Neck:     Musculoskeletal: Neck supple.  Cardiovascular:     Rate and Rhythm: Normal rate.  Pulmonary:     Effort: Pulmonary effort is normal.  Skin:    General: Skin is warm and dry.     Comments: Bilateral legs with scattered erythematous linear papular lesions with a few scattered vesicles. Also with smooth  erythematous papular/plaque lesions on the face. Erythema and swelling under the left eye  Neurological:     Mental Status: He is alert. Mental status is at baseline.  Psychiatric:        Mood and Affect: Mood normal.           Assessment & Plan:   Problem List Items Addressed This Visit    None    Visit Diagnoses    Poison oak dermatitis    -  Primary   Relevant Medications   predniSONE (DELTASONE) 20 MG tablet     Given distribution and facial involvement will treat with prednisone for suspected poison oak. Also recommended topical treatment. Advised follow-up if symptoms worsening or not improving or returning after completion of prednisone  Return if symptoms worsen or fail to improve.  Lesleigh Noe, MD

## 2018-12-21 ENCOUNTER — Other Ambulatory Visit: Payer: Self-pay

## 2018-12-21 ENCOUNTER — Ambulatory Visit (INDEPENDENT_AMBULATORY_CARE_PROVIDER_SITE_OTHER): Payer: BC Managed Care – PPO | Admitting: Internal Medicine

## 2018-12-21 ENCOUNTER — Encounter: Payer: Self-pay | Admitting: Internal Medicine

## 2018-12-21 VITALS — BP 136/84 | HR 77 | Temp 98.5°F | Ht 67.0 in | Wt 162.0 lb

## 2018-12-21 DIAGNOSIS — G2581 Restless legs syndrome: Secondary | ICD-10-CM

## 2018-12-21 DIAGNOSIS — Z23 Encounter for immunization: Secondary | ICD-10-CM

## 2018-12-21 DIAGNOSIS — E785 Hyperlipidemia, unspecified: Secondary | ICD-10-CM

## 2018-12-21 DIAGNOSIS — Z0000 Encounter for general adult medical examination without abnormal findings: Secondary | ICD-10-CM

## 2018-12-21 DIAGNOSIS — Z125 Encounter for screening for malignant neoplasm of prostate: Secondary | ICD-10-CM | POA: Diagnosis not present

## 2018-12-21 LAB — COMPREHENSIVE METABOLIC PANEL
ALT: 20 U/L (ref 0–53)
AST: 22 U/L (ref 0–37)
Albumin: 4 g/dL (ref 3.5–5.2)
Alkaline Phosphatase: 56 U/L (ref 39–117)
BUN: 10 mg/dL (ref 6–23)
CO2: 31 mEq/L (ref 19–32)
Calcium: 9.8 mg/dL (ref 8.4–10.5)
Chloride: 106 mEq/L (ref 96–112)
Creatinine, Ser: 0.98 mg/dL (ref 0.40–1.50)
GFR: 77 mL/min (ref >60.00)
Glucose, Bld: 92 mg/dL (ref 70–99)
Potassium: 4.8 mEq/L (ref 3.5–5.1)
Sodium: 141 mEq/L (ref 135–145)
Total Bilirubin: 0.3 mg/dL (ref 0.2–1.2)
Total Protein: 6.5 g/dL (ref 6.0–8.3)

## 2018-12-21 LAB — CBC
HCT: 44.1 % (ref 39.0–52.0)
Hemoglobin: 14.6 g/dL (ref 13.0–17.0)
MCHC: 33 g/dL (ref 30.0–36.0)
MCV: 100.8 fl — ABNORMAL HIGH (ref 78.0–100.0)
Platelets: 190 10*3/uL (ref 150.0–400.0)
RBC: 4.38 Mil/uL (ref 4.22–5.81)
RDW: 13.2 % (ref 11.5–15.5)
WBC: 4.5 10*3/uL (ref 4.0–10.5)

## 2018-12-21 LAB — PSA: PSA: 1.09 ng/mL (ref 0.10–4.00)

## 2018-12-21 MED ORDER — CLONAZEPAM 0.5 MG PO TABS: 0.5000 mg | ORAL_TABLET | Freq: Every evening | ORAL | 1 refills | Status: DC | PRN
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
## 2018-12-21 NOTE — Assessment & Plan Note (Signed)
Healthy--discussed exercise Flu vaccine today Colon due 2024 shingrix  Will check PSA after discussion

## 2018-12-21 NOTE — Assessment & Plan Note (Signed)
Will add clonazepam for prn use On the gabapentin

## 2018-12-21 NOTE — Assessment & Plan Note (Signed)
Will recheck labs 

## 2018-12-21 NOTE — Addendum Note (Signed)
Addended by: Pilar Grammes on: 12/21/2018 01:56 PM   Modules accepted: Orders

## 2018-12-21 NOTE — Addendum Note (Signed)
Addended by: Pilar Grammes on: 12/21/2018 02:13 PM   Modules accepted: Orders

## 2019-01-18 ENCOUNTER — Other Ambulatory Visit: Payer: Self-pay

## 2019-01-18 ENCOUNTER — Other Ambulatory Visit
Admission: RE | Admit: 2019-01-18 | Discharge: 2019-01-18 | Disposition: A | Discharge status: Home / Self Care | Payer: BC Managed Care – PPO | Source: Ambulatory Visit | Attending: Orthopedic Surgery | Admitting: Orthopedic Surgery

## 2019-01-18 ENCOUNTER — Other Ambulatory Visit (HOSPITAL_COMMUNITY): Payer: BC Managed Care – PPO

## 2019-01-18 ENCOUNTER — Encounter (HOSPITAL_COMMUNITY): Payer: Self-pay | Admitting: *Deleted

## 2019-01-18 DIAGNOSIS — Z20828 Contact with and (suspected) exposure to other viral communicable diseases: Secondary | ICD-10-CM | POA: Insufficient documentation

## 2019-01-18 DIAGNOSIS — Z01812 Encounter for preprocedural laboratory examination: Secondary | ICD-10-CM | POA: Diagnosis not present

## 2019-01-18 LAB — SARS CORONAVIRUS 2 (TAT 6-24 HRS): SARS Coronavirus 2: NEGATIVE

## 2019-01-18 NOTE — Progress Notes (Signed)
Denies chest pain, shob, or cardiology visit/ test. Reports compliance with quarantine request. Educated on visitation policy.
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
## 2019-01-20 ENCOUNTER — Other Ambulatory Visit: Payer: Self-pay

## 2019-01-20 ENCOUNTER — Inpatient Hospital Stay (HOSPITAL_COMMUNITY): Payer: BC Managed Care – PPO

## 2019-01-20 ENCOUNTER — Encounter (HOSPITAL_COMMUNITY): Payer: Self-pay

## 2019-01-20 ENCOUNTER — Inpatient Hospital Stay (HOSPITAL_COMMUNITY): Payer: BC Managed Care – PPO | Admitting: Anesthesiology

## 2019-01-20 ENCOUNTER — Inpatient Hospital Stay (HOSPITAL_COMMUNITY)
Admission: RE | Admit: 2019-01-20 | Discharge: 2019-01-21 | DRG: 493 | Disposition: A | Discharge status: Home / Self Care | Payer: BC Managed Care – PPO | Attending: Orthopedic Surgery | Admitting: Orthopedic Surgery

## 2019-01-20 ENCOUNTER — Observation Stay (HOSPITAL_COMMUNITY): Payer: BC Managed Care – PPO

## 2019-01-20 ENCOUNTER — Encounter (HOSPITAL_COMMUNITY)
Admission: RE | Disposition: A | Discharge status: Home / Self Care | Payer: Self-pay | Source: Home / Self Care | Attending: Orthopedic Surgery

## 2019-01-20 DIAGNOSIS — S82291K Other fracture of shaft of right tibia, subsequent encounter for closed fracture with nonunion: Secondary | ICD-10-CM

## 2019-01-20 DIAGNOSIS — S82401P Unspecified fracture of shaft of right fibula, subsequent encounter for closed fracture with malunion: Secondary | ICD-10-CM | POA: Diagnosis not present

## 2019-01-20 DIAGNOSIS — S82291P Other fracture of shaft of right tibia, subsequent encounter for closed fracture with malunion: Secondary | ICD-10-CM | POA: Diagnosis not present

## 2019-01-20 DIAGNOSIS — D62 Acute posthemorrhagic anemia: Secondary | ICD-10-CM | POA: Diagnosis not present

## 2019-01-20 DIAGNOSIS — M19071 Primary osteoarthritis, right ankle and foot: Secondary | ICD-10-CM | POA: Diagnosis present

## 2019-01-20 DIAGNOSIS — E785 Hyperlipidemia, unspecified: Secondary | ICD-10-CM | POA: Diagnosis present

## 2019-01-20 DIAGNOSIS — X58XXXD Exposure to other specified factors, subsequent encounter: Secondary | ICD-10-CM | POA: Diagnosis present

## 2019-01-20 DIAGNOSIS — Z8619 Personal history of other infectious and parasitic diseases: Secondary | ICD-10-CM | POA: Diagnosis present

## 2019-01-20 DIAGNOSIS — S82201P Unspecified fracture of shaft of right tibia, subsequent encounter for closed fracture with malunion: Secondary | ICD-10-CM | POA: Diagnosis present

## 2019-01-20 DIAGNOSIS — K219 Gastro-esophageal reflux disease without esophagitis: Secondary | ICD-10-CM | POA: Diagnosis present

## 2019-01-20 DIAGNOSIS — Z87891 Personal history of nicotine dependence: Secondary | ICD-10-CM

## 2019-01-20 DIAGNOSIS — B182 Chronic viral hepatitis C: Secondary | ICD-10-CM | POA: Diagnosis present

## 2019-01-20 DIAGNOSIS — M898X9 Other specified disorders of bone, unspecified site: Secondary | ICD-10-CM | POA: Diagnosis present

## 2019-01-20 DIAGNOSIS — G2581 Restless legs syndrome: Secondary | ICD-10-CM | POA: Diagnosis present

## 2019-01-20 DIAGNOSIS — S82401K Unspecified fracture of shaft of right fibula, subsequent encounter for closed fracture with nonunion: Secondary | ICD-10-CM

## 2019-01-20 DIAGNOSIS — M1711 Unilateral primary osteoarthritis, right knee: Secondary | ICD-10-CM | POA: Diagnosis present

## 2019-01-20 DIAGNOSIS — Z01818 Encounter for other preprocedural examination: Secondary | ICD-10-CM

## 2019-01-20 DIAGNOSIS — Z419 Encounter for procedure for purposes other than remedying health state, unspecified: Secondary | ICD-10-CM

## 2019-01-20 DIAGNOSIS — M21161 Varus deformity, not elsewhere classified, right knee: Secondary | ICD-10-CM | POA: Diagnosis present

## 2019-01-20 HISTORY — DX: Restless legs syndrome: G25.81

## 2019-01-20 HISTORY — PX: TIBIA IM NAIL INSERTION: SHX2516

## 2019-01-20 HISTORY — PX: OSTECTOMY: SHX6439

## 2019-01-20 LAB — COMPREHENSIVE METABOLIC PANEL
ALT: 17 U/L (ref 0–44)
AST: 22 U/L (ref 15–41)
Albumin: 3.7 g/dL (ref 3.5–5.0)
Alkaline Phosphatase: 60 U/L (ref 38–126)
Anion gap: 10 (ref 5–15)
BUN: 12 mg/dL (ref 8–23)
CO2: 24 mmol/L (ref 22–32)
Calcium: 9.4 mg/dL (ref 8.9–10.3)
Chloride: 106 mmol/L (ref 98–111)
Creatinine, Ser: 0.93 mg/dL (ref 0.61–1.24)
GFR calc Af Amer: >60 mL/min (ref >60)
GFR calc non Af Amer: >60 mL/min (ref >60)
Glucose, Bld: 104 mg/dL — ABNORMAL HIGH (ref 70–99)
Potassium: 3.9 mmol/L (ref 3.5–5.1)
Sodium: 140 mmol/L (ref 135–145)
Total Bilirubin: 0.9 mg/dL (ref 0.3–1.2)
Total Protein: 6.5 g/dL (ref 6.5–8.1)

## 2019-01-20 LAB — CBC WITH DIFFERENTIAL/PLATELET
Abs Immature Granulocytes: 0.02 10*3/uL (ref 0.00–0.07)
Basophils Absolute: 0 10*3/uL (ref 0.0–0.1)
Basophils Relative: 1 %
Eosinophils Absolute: 0.3 10*3/uL (ref 0.0–0.5)
Eosinophils Relative: 5 %
HCT: 44.8 % (ref 39.0–52.0)
Hemoglobin: 15.1 g/dL (ref 13.0–17.0)
Immature Granulocytes: 0 %
Lymphocytes Relative: 34 %
Lymphs Abs: 1.6 10*3/uL (ref 0.7–4.0)
MCH: 33 pg (ref 26.0–34.0)
MCHC: 33.7 g/dL (ref 30.0–36.0)
MCV: 97.8 fL (ref 80.0–100.0)
Monocytes Absolute: 0.8 10*3/uL (ref 0.1–1.0)
Monocytes Relative: 18 %
Neutro Abs: 2 10*3/uL (ref 1.7–7.7)
Neutrophils Relative %: 42 %
Platelets: 185 10*3/uL (ref 150–400)
RBC: 4.58 MIL/uL (ref 4.22–5.81)
RDW: 12.2 % (ref 11.5–15.5)
WBC: 4.7 10*3/uL (ref 4.0–10.5)
nRBC: 0 % (ref 0.0–0.2)

## 2019-01-20 LAB — PROTIME-INR
INR: 0.9 (ref 0.8–1.2)
Prothrombin Time: 12.1 seconds (ref 11.4–15.2)

## 2019-01-20 LAB — VITAMIN D 25 HYDROXY (VIT D DEFICIENCY, FRACTURES): Vit D, 25-Hydroxy: 30.01 ng/mL (ref 30–100)

## 2019-01-20 LAB — APTT: aPTT: 29 seconds (ref 24–36)

## 2019-01-20 SURGERY — INSERTION, INTRAMEDULLARY ROD, TIBIA
Anesthesia: General | Laterality: Right

## 2019-01-20 MED ORDER — PHENYLEPHRINE 40 MCG/ML (10ML) SYRINGE FOR IV PUSH (FOR BLOOD PRESSURE SUPPORT)
PREFILLED_SYRINGE | INTRAVENOUS | Status: DC | PRN
  Administered 2019-01-20 (×4): 120 ug via INTRAVENOUS
  Administered 2019-01-20: 160 ug via INTRAVENOUS
  Administered 2019-01-20: 80 ug via INTRAVENOUS

## 2019-01-20 MED ORDER — LIDOCAINE 2% (20 MG/ML) 5 ML SYRINGE
INTRAMUSCULAR | Status: DC | PRN
  Administered 2019-01-20: 40 mg via INTRAVENOUS
  Administered 2019-01-20: 60 mg via INTRAVENOUS

## 2019-01-20 MED ORDER — KETAMINE HCL 50 MG/5ML IJ SOSY
PREFILLED_SYRINGE | INTRAMUSCULAR | Status: AC
  Filled 2019-01-20: qty 5

## 2019-01-20 MED ORDER — KETOROLAC TROMETHAMINE 15 MG/ML IJ SOLN
7.5000 mg | Freq: Four times a day (QID) | INTRAMUSCULAR | Status: DC
  Administered 2019-01-20 – 2019-01-21 (×4): 7.5 mg via INTRAVENOUS
  Filled 2019-01-20 (×4): qty 1

## 2019-01-20 MED ORDER — DOCUSATE SODIUM 100 MG PO CAPS
100.0000 mg | ORAL_CAPSULE | Freq: Two times a day (BID) | ORAL | Status: DC
  Administered 2019-01-20 – 2019-01-21 (×2): 100 mg via ORAL
  Filled 2019-01-20 (×2): qty 1

## 2019-01-20 MED ORDER — ACETAMINOPHEN 325 MG PO TABS: 325.0000 mg | ORAL_TABLET | Freq: Four times a day (QID) | ORAL | Status: DC | PRN

## 2019-01-20 MED ORDER — ONDANSETRON HCL 4 MG/2ML IJ SOLN
4.0000 mg | Freq: Once | INTRAMUSCULAR | Status: AC | PRN
  Administered 2019-01-20: 4 mg via INTRAVENOUS

## 2019-01-20 MED ORDER — MIDAZOLAM HCL 2 MG/2ML IJ SOLN
INTRAMUSCULAR | Status: AC
  Filled 2019-01-20: qty 2

## 2019-01-20 MED ORDER — ROCURONIUM BROMIDE 10 MG/ML (PF) SYRINGE
PREFILLED_SYRINGE | INTRAVENOUS | Status: DC | PRN
  Administered 2019-01-20: 30 mg via INTRAVENOUS
  Administered 2019-01-20: 10 mg via INTRAVENOUS
  Administered 2019-01-20: 70 mg via INTRAVENOUS
  Administered 2019-01-20: 20 mg via INTRAVENOUS

## 2019-01-20 MED ORDER — OXYCODONE HCL 5 MG PO TABS
ORAL_TABLET | ORAL | Status: AC
  Filled 2019-01-20: qty 1

## 2019-01-20 MED ORDER — GABAPENTIN 300 MG PO CAPS
300.0000 mg | ORAL_CAPSULE | Freq: Once | ORAL | Status: AC
  Administered 2019-01-20: 300 mg via ORAL
  Filled 2019-01-20: qty 1

## 2019-01-20 MED ORDER — CHLORHEXIDINE GLUCONATE 4 % EX LIQD: 60.0000 mL | Freq: Once | CUTANEOUS | Status: DC

## 2019-01-20 MED ORDER — METOCLOPRAMIDE HCL 5 MG PO TABS: 5.0000 mg | ORAL_TABLET | Freq: Three times a day (TID) | ORAL | Status: DC | PRN

## 2019-01-20 MED ORDER — PANTOPRAZOLE SODIUM 40 MG PO TBEC
40.0000 mg | DELAYED_RELEASE_TABLET | Freq: Every day | ORAL | Status: DC
  Administered 2019-01-21: 40 mg via ORAL
  Filled 2019-01-20: qty 1

## 2019-01-20 MED ORDER — OXYCODONE HCL 5 MG/5ML PO SOLN: 5.0000 mg | Freq: Once | ORAL | Status: AC | PRN

## 2019-01-20 MED ORDER — METAXALONE 800 MG PO TABS
400.0000 mg | ORAL_TABLET | Freq: Three times a day (TID) | ORAL | Status: DC
  Administered 2019-01-20 – 2019-01-21 (×2): 400 mg via ORAL
  Filled 2019-01-20: qty 0.5
  Filled 2019-01-20: qty 1
  Filled 2019-01-20 (×4): qty 0.5
  Filled 2019-01-20: qty 1

## 2019-01-20 MED ORDER — PROPOFOL 10 MG/ML IV BOLUS
INTRAVENOUS | Status: DC | PRN
  Administered 2019-01-20: 50 mg via INTRAVENOUS
  Administered 2019-01-20: 150 mg via INTRAVENOUS

## 2019-01-20 MED ORDER — OXYCODONE HCL 5 MG PO TABS
5.0000 mg | ORAL_TABLET | Freq: Once | ORAL | Status: AC | PRN
  Administered 2019-01-20: 5 mg via ORAL

## 2019-01-20 MED ORDER — ONDANSETRON HCL 4 MG/2ML IJ SOLN
INTRAMUSCULAR | Status: DC | PRN
  Administered 2019-01-20: 4 mg via INTRAVENOUS

## 2019-01-20 MED ORDER — POTASSIUM CHLORIDE IN NACL 20-0.9 MEQ/L-% IV SOLN
INTRAVENOUS | Status: DC
  Administered 2019-01-20: 21:00:00 via INTRAVENOUS
  Filled 2019-01-20: qty 1000

## 2019-01-20 MED ORDER — HYDROMORPHONE HCL 1 MG/ML IJ SOLN
0.5000 mg | INTRAMUSCULAR | Status: DC | PRN
  Administered 2019-01-20: 0.5 mg via INTRAVENOUS
  Administered 2019-01-21: 1 mg via INTRAVENOUS
  Filled 2019-01-20 (×2): qty 1

## 2019-01-20 MED ORDER — FENTANYL CITRATE (PF) 250 MCG/5ML IJ SOLN
INTRAMUSCULAR | Status: AC
  Filled 2019-01-20: qty 5

## 2019-01-20 MED ORDER — POLYETHYLENE GLYCOL 3350 17 G PO PACK
17.0000 g | PACK | Freq: Every day | ORAL | Status: DC
  Administered 2019-01-21: 17 g via ORAL
  Filled 2019-01-20: qty 1

## 2019-01-20 MED ORDER — KETAMINE HCL 10 MG/ML IJ SOLN
INTRAMUSCULAR | Status: DC | PRN
  Administered 2019-01-20: 30 mg via INTRAVENOUS
  Administered 2019-01-20 (×2): 10 mg via INTRAVENOUS

## 2019-01-20 MED ORDER — DEXAMETHASONE SODIUM PHOSPHATE 10 MG/ML IJ SOLN
INTRAMUSCULAR | Status: AC
  Filled 2019-01-20: qty 1

## 2019-01-20 MED ORDER — FENTANYL CITRATE (PF) 100 MCG/2ML IJ SOLN
INTRAMUSCULAR | Status: AC
  Filled 2019-01-20: qty 2

## 2019-01-20 MED ORDER — FENTANYL CITRATE (PF) 100 MCG/2ML IJ SOLN
25.0000 ug | INTRAMUSCULAR | Status: DC | PRN
  Administered 2019-01-20 (×3): 50 ug via INTRAVENOUS

## 2019-01-20 MED ORDER — 0.9 % SODIUM CHLORIDE (POUR BTL) OPTIME
TOPICAL | Status: DC | PRN
  Administered 2019-01-20: 1000 mL

## 2019-01-20 MED ORDER — ROCURONIUM BROMIDE 10 MG/ML (PF) SYRINGE
PREFILLED_SYRINGE | INTRAVENOUS | Status: AC
  Filled 2019-01-20: qty 10

## 2019-01-20 MED ORDER — ONDANSETRON HCL 4 MG/2ML IJ SOLN
INTRAMUSCULAR | Status: AC
  Filled 2019-01-20: qty 2

## 2019-01-20 MED ORDER — LACTATED RINGERS IV SOLN
INTRAVENOUS | Status: DC
  Administered 2019-01-20 (×2): via INTRAVENOUS

## 2019-01-20 MED ORDER — DEXAMETHASONE SODIUM PHOSPHATE 10 MG/ML IJ SOLN
INTRAMUSCULAR | Status: DC | PRN
  Administered 2019-01-20: 10 mg via INTRAVENOUS

## 2019-01-20 MED ORDER — GABAPENTIN 300 MG PO CAPS
300.0000 mg | ORAL_CAPSULE | Freq: Every day | ORAL | Status: DC
  Administered 2019-01-20: 300 mg via ORAL
  Filled 2019-01-20: qty 1

## 2019-01-20 MED ORDER — ONDANSETRON HCL 4 MG/2ML IJ SOLN: 4.0000 mg | Freq: Four times a day (QID) | INTRAMUSCULAR | Status: DC | PRN

## 2019-01-20 MED ORDER — LIDOCAINE 2% (20 MG/ML) 5 ML SYRINGE
INTRAMUSCULAR | Status: AC
  Filled 2019-01-20: qty 5

## 2019-01-20 MED ORDER — MIDAZOLAM HCL 5 MG/5ML IJ SOLN
INTRAMUSCULAR | Status: DC | PRN
  Administered 2019-01-20: 2 mg via INTRAVENOUS

## 2019-01-20 MED ORDER — OXYCODONE HCL 5 MG PO TABS
5.0000 mg | ORAL_TABLET | ORAL | Status: DC | PRN
  Administered 2019-01-21: 10 mg via ORAL
  Filled 2019-01-20: qty 2

## 2019-01-20 MED ORDER — SUGAMMADEX SODIUM 200 MG/2ML IV SOLN
INTRAVENOUS | Status: DC | PRN
  Administered 2019-01-20: 150 mg via INTRAVENOUS

## 2019-01-20 MED ORDER — METOCLOPRAMIDE HCL 5 MG/ML IJ SOLN: 5.0000 mg | Freq: Three times a day (TID) | INTRAMUSCULAR | Status: DC | PRN

## 2019-01-20 MED ORDER — CEFAZOLIN SODIUM-DEXTROSE 2-4 GM/100ML-% IV SOLN
2.0000 g | INTRAVENOUS | Status: AC
  Administered 2019-01-20: 2 g via INTRAVENOUS
  Filled 2019-01-20: qty 100

## 2019-01-20 MED ORDER — ENOXAPARIN SODIUM 40 MG/0.4ML ~~LOC~~ SOLN
40.0000 mg | SUBCUTANEOUS | Status: DC
  Administered 2019-01-21: 40 mg via SUBCUTANEOUS
  Filled 2019-01-20: qty 0.4

## 2019-01-20 MED ORDER — OXYCODONE-ACETAMINOPHEN 5-325 MG PO TABS
1.0000 | ORAL_TABLET | Freq: Four times a day (QID) | ORAL | Status: DC | PRN
  Administered 2019-01-20 – 2019-01-21 (×2): 2 via ORAL
  Filled 2019-01-20 (×2): qty 2

## 2019-01-20 MED ORDER — ACETAMINOPHEN 500 MG PO TABS
1000.0000 mg | ORAL_TABLET | Freq: Once | ORAL | Status: AC
  Administered 2019-01-20: 1000 mg via ORAL
  Filled 2019-01-20: qty 2

## 2019-01-20 MED ORDER — CEFAZOLIN SODIUM-DEXTROSE 1-4 GM/50ML-% IV SOLN
1.0000 g | Freq: Four times a day (QID) | INTRAVENOUS | Status: AC
  Administered 2019-01-20 – 2019-01-21 (×3): 1 g via INTRAVENOUS
  Filled 2019-01-20 (×3): qty 50

## 2019-01-20 MED ORDER — CLONAZEPAM 0.5 MG PO TABS
0.5000 mg | ORAL_TABLET | Freq: Every evening | ORAL | Status: DC | PRN
  Administered 2019-01-20: 0.5 mg via ORAL
  Filled 2019-01-20: qty 1

## 2019-01-20 MED ORDER — PHENYLEPHRINE HCL-NACL 10-0.9 MG/250ML-% IV SOLN
INTRAVENOUS | Status: DC | PRN
  Administered 2019-01-20: 50 ug/min via INTRAVENOUS

## 2019-01-20 MED ORDER — ONDANSETRON HCL 4 MG PO TABS: 4.0000 mg | ORAL_TABLET | Freq: Four times a day (QID) | ORAL | Status: DC | PRN

## 2019-01-20 MED ORDER — FENTANYL CITRATE (PF) 250 MCG/5ML IJ SOLN
INTRAMUSCULAR | Status: DC | PRN
  Administered 2019-01-20 (×5): 50 ug via INTRAVENOUS

## 2019-01-20 MED ORDER — PROPOFOL 10 MG/ML IV BOLUS
INTRAVENOUS | Status: AC
  Filled 2019-01-20: qty 20

## 2019-01-20 SURGICAL SUPPLY — 60 items
BIT DRILL 3.8X6 NS (BIT) ×3 IMPLANT
BIT DRILL 4.4 NS (BIT) ×3 IMPLANT
BLADE SAW RECIPROCATING 77.5 (BLADE) ×3 IMPLANT
BLADE SURG 10 STRL SS (BLADE) ×3 IMPLANT
BNDG ELASTIC 4X5.8 VLCR STR LF (GAUZE/BANDAGES/DRESSINGS) ×3 IMPLANT
BNDG ELASTIC 6X5.8 VLCR STR LF (GAUZE/BANDAGES/DRESSINGS) ×3 IMPLANT
BRUSH SCRUB EZ PLAIN DRY (MISCELLANEOUS) ×6 IMPLANT
COVER SURGICAL LIGHT HANDLE (MISCELLANEOUS) ×6 IMPLANT
COVER WAND RF STERILE (DRAPES) ×3 IMPLANT
DRAPE C-ARM 42X72 X-RAY (DRAPES) ×3 IMPLANT
DRAPE C-ARMOR (DRAPES) ×3 IMPLANT
DRAPE HALF SHEET 40X57 (DRAPES) IMPLANT
DRAPE INCISE IOBAN 66X45 STRL (DRAPES) IMPLANT
DRAPE U-SHAPE 47X51 STRL (DRAPES) ×3 IMPLANT
DRSG ADAPTIC 3X8 NADH LF (GAUZE/BANDAGES/DRESSINGS) ×3 IMPLANT
DRSG MEPITEL 4X7.2 (GAUZE/BANDAGES/DRESSINGS) ×3 IMPLANT
DRSG PAD ABDOMINAL 8X10 ST (GAUZE/BANDAGES/DRESSINGS) ×6 IMPLANT
ELECT REM PT RETURN 9FT ADLT (ELECTROSURGICAL) ×3
ELECTRODE REM PT RTRN 9FT ADLT (ELECTROSURGICAL) ×1 IMPLANT
GAUZE SPONGE 4X4 12PLY STRL (GAUZE/BANDAGES/DRESSINGS) ×3 IMPLANT
GLOVE BIO SURGEON STRL SZ7.5 (GLOVE) ×3 IMPLANT
GLOVE BIO SURGEON STRL SZ8 (GLOVE) ×3 IMPLANT
GLOVE BIO SURGEON STRL SZ8.5 (GLOVE) ×3 IMPLANT
GLOVE BIOGEL PI IND STRL 7.5 (GLOVE) ×1 IMPLANT
GLOVE BIOGEL PI IND STRL 8 (GLOVE) ×1 IMPLANT
GLOVE BIOGEL PI INDICATOR 7.5 (GLOVE) ×2
GLOVE BIOGEL PI INDICATOR 8 (GLOVE) ×2
GOWN STRL REUS W/ TWL LRG LVL3 (GOWN DISPOSABLE) ×2 IMPLANT
GOWN STRL REUS W/ TWL XL LVL3 (GOWN DISPOSABLE) ×1 IMPLANT
GOWN STRL REUS W/TWL LRG LVL3 (GOWN DISPOSABLE) ×4
GOWN STRL REUS W/TWL XL LVL3 (GOWN DISPOSABLE) ×2
GUIDEWIRE BALL NOSE 80CM (WIRE) ×3 IMPLANT
HEMOSTAT ARISTA ABSORB 3G PWDR (HEMOSTASIS) ×3 IMPLANT
KIT BASIN OR (CUSTOM PROCEDURE TRAY) ×3 IMPLANT
KIT TURNOVER KIT B (KITS) ×3 IMPLANT
NAIL TIBIA 8X33CM (Nail) ×3 IMPLANT
PACK ORTHO EXTREMITY (CUSTOM PROCEDURE TRAY) ×3 IMPLANT
PAD ABD 8X10 STRL (GAUZE/BANDAGES/DRESSINGS) ×6 IMPLANT
PAD ARMBOARD 7.5X6 YLW CONV (MISCELLANEOUS) ×6 IMPLANT
PAD CAST 4YDX4 CTTN HI CHSV (CAST SUPPLIES) ×1 IMPLANT
PADDING CAST ABS 4INX4YD NS (CAST SUPPLIES) ×2
PADDING CAST ABS 6INX4YD NS (CAST SUPPLIES) ×2
PADDING CAST ABS COTTON 4X4 ST (CAST SUPPLIES) ×1 IMPLANT
PADDING CAST ABS COTTON 6X4 NS (CAST SUPPLIES) ×1 IMPLANT
PADDING CAST COTTON 4X4 STRL (CAST SUPPLIES) ×2
PADDING CAST COTTON 6X4 STRL (CAST SUPPLIES) ×3 IMPLANT
SCREW ACECAP 40MM (Screw) ×3 IMPLANT
SCREW ACECAP 48MM (Screw) ×3 IMPLANT
SCREW PROXIMAL DEPUY (Screw) ×4 IMPLANT
SCREW PRXML FT 55X5.5XNS TIB (Screw) ×1 IMPLANT
SCREW PRXML FT 60X5.5XNS LF (Screw) ×1 IMPLANT
STAPLER VISISTAT 35W (STAPLE) ×3 IMPLANT
SUT ETHILON 3 0 PS 1 (SUTURE) ×6 IMPLANT
SUT VIC AB 0 CT1 27 (SUTURE)
SUT VIC AB 0 CT1 27XBRD ANBCTR (SUTURE) IMPLANT
SUT VIC AB 2-0 CT1 27 (SUTURE) ×2
SUT VIC AB 2-0 CT1 TAPERPNT 27 (SUTURE) ×1 IMPLANT
TOWEL GREEN STERILE (TOWEL DISPOSABLE) ×6 IMPLANT
TOWEL GREEN STERILE FF (TOWEL DISPOSABLE) ×3 IMPLANT
YANKAUER SUCT BULB TIP NO VENT (SUCTIONS) ×3 IMPLANT

## 2019-01-20 NOTE — Anesthesia Postprocedure Evaluation (Signed)
Anesthesia Post Note  Patient: Daryl Cook  Procedure(s) Performed: INTRAMEDULLARY (IM) NAIL TIBIAL SHAFT (Right ) OSTECTOMY RIGHT TIBIAL (Right )     Patient location during evaluation: PACU Anesthesia Type: General Level of consciousness: awake and alert Pain management: pain level controlled Vital Signs Assessment: post-procedure vital signs reviewed and stable Respiratory status: spontaneous breathing, nonlabored ventilation and respiratory function stable Cardiovascular status: blood pressure returned to baseline and stable Postop Assessment: no apparent nausea or vomiting Anesthetic complications: no    Last Vitals:  Vitals:   01/20/19 1321 01/20/19 1434  BP: 132/90 (!) 150/84  Pulse: 95 86  Resp: (!) 8 15  Temp:  36.6 C  SpO2: 92% 97%                   Audry Pili

## 2019-01-20 NOTE — Progress Notes (Signed)
Orthopedic Tech Progress Note Patient Details:  Daryl Cook [birth year] [id number]  Ortho Devices Ortho Device/Splint Location: Trapeze bar Ortho Device/Splint Interventions: Application   Post Interventions Patient Tolerated: Well Instructions Provided: Care of device   [name] 01/20/2019, 6:35 PM

## 2019-01-20 NOTE — Anesthesia Procedure Notes (Signed)
Procedure Name: Intubation Performed by: Milford Cage, CRNA Pre-anesthesia Checklist: Patient identified, Emergency Drugs available, Suction available and Patient being monitored Patient Re-evaluated:Patient Re-evaluated prior to induction Oxygen Delivery Method: Circle System Utilized Preoxygenation: Pre-oxygenation with 100% oxygen Induction Type: IV induction Ventilation: Mask ventilation with difficulty Laryngoscope Size: Miller and 2 Grade View: Grade I Tube type: Oral Tube size: 7.5 mm Number of attempts: 1 Airway Equipment and Method: Stylet and Oral airway Placement Confirmation: ETT inserted through vocal cords under direct vision,  positive ETCO2 and breath sounds checked- equal and bilateral Secured at: 22 cm Tube secured with: Tape Dental Injury: Teeth and Oropharynx as per pre-operative assessment

## 2019-01-20 NOTE — Transfer of Care (Signed)
Immediate Anesthesia Transfer of Care Note  Patient: Daryl Cook  Procedure(s) Performed: INTRAMEDULLARY (IM) NAIL TIBIAL SHAFT (Right ) OSTECTOMY RIGHT TIBIAL (Right )  Patient Location: PACU  Anesthesia Type:General  Level of Consciousness: awake  Airway & Oxygen Therapy: Patient Spontanous Breathing  Post-op Assessment: Report given to RN and Post -op Vital signs reviewed and stable  Post vital signs: Reviewed and stable  Last Vitals:  Vitals Value Taken Time  BP 154/94 01/20/19 1306  Temp    Pulse 103 01/20/19 1308  Resp 18 01/20/19 1308  SpO2 97 % 01/20/19 1308  Vitals shown include unvalidated device data.  Last Pain:  Vitals:   01/20/19 0733  TempSrc:   PainSc: 0-No pain      Patients Stated Pain Goal: 2 ([id number])  Complications: No apparent anesthesia complications

## 2019-01-21 ENCOUNTER — Encounter (HOSPITAL_COMMUNITY): Payer: Self-pay | Admitting: Orthopedic Surgery

## 2019-01-21 DIAGNOSIS — M1711 Unilateral primary osteoarthritis, right knee: Secondary | ICD-10-CM

## 2019-01-21 HISTORY — DX: Unilateral primary osteoarthritis, right knee: M17.11

## 2019-01-21 LAB — CBC
HCT: 40.2 % (ref 39.0–52.0)
Hemoglobin: 13.4 g/dL (ref 13.0–17.0)
MCH: 32.8 pg (ref 26.0–34.0)
MCHC: 33.3 g/dL (ref 30.0–36.0)
MCV: 98.3 fL (ref 80.0–100.0)
Platelets: 192 10*3/uL (ref 150–400)
RBC: 4.09 MIL/uL — ABNORMAL LOW (ref 4.22–5.81)
RDW: 12.1 % (ref 11.5–15.5)
WBC: 12.5 10*3/uL — ABNORMAL HIGH (ref 4.0–10.5)
nRBC: 0 % (ref 0.0–0.2)

## 2019-01-21 MED ORDER — VITAMIN D 125 MCG (5000 UT) PO CAPS: 1.0000 | ORAL_CAPSULE | Freq: Every day | ORAL | 2 refills | Status: DC

## 2019-01-21 MED ORDER — HYDRALAZINE HCL 10 MG PO TABS: 10.0000 mg | ORAL_TABLET | Freq: Three times a day (TID) | ORAL | Status: DC | PRN

## 2019-01-21 MED ORDER — ACETAMINOPHEN 500 MG PO TABS
500.0000 mg | ORAL_TABLET | Freq: Two times a day (BID) | ORAL | Status: DC
  Administered 2019-01-21: 500 mg via ORAL
  Filled 2019-01-21: qty 1

## 2019-01-21 MED ORDER — ACETAMINOPHEN 500 MG PO TABS: 500.0000 mg | ORAL_TABLET | Freq: Two times a day (BID) | ORAL | 0 refills | Status: DC

## 2019-01-21 MED ORDER — HYDROCODONE-ACETAMINOPHEN 7.5-325 MG PO TABS
1.0000 | ORAL_TABLET | Freq: Four times a day (QID) | ORAL | Status: DC | PRN
  Administered 2019-01-21: 2 via ORAL
  Filled 2019-01-21: qty 2

## 2019-01-21 MED ORDER — METAXALONE 400 MG PO TABS: 400.0000 mg | ORAL_TABLET | Freq: Three times a day (TID) | ORAL | 0 refills | Status: DC

## 2019-01-21 MED ORDER — ENOXAPARIN SODIUM 40 MG/0.4ML ~~LOC~~ SOLN: 40.0000 mg | SUBCUTANEOUS | 0 refills | Status: DC

## 2019-01-21 MED ORDER — DOCUSATE SODIUM 100 MG PO CAPS: 100.0000 mg | ORAL_CAPSULE | Freq: Two times a day (BID) | ORAL | 0 refills | Status: DC

## 2019-01-21 MED ORDER — VITAMIN D 25 MCG (1000 UNIT) PO TABS
2000.0000 [IU] | ORAL_TABLET | Freq: Two times a day (BID) | ORAL | Status: DC
  Administered 2019-01-21: 2000 [IU] via ORAL
  Filled 2019-01-21: qty 2

## 2019-01-21 MED ORDER — ASCORBIC ACID 500 MG PO TABS: 500.0000 mg | ORAL_TABLET | Freq: Every day | ORAL | 1 refills | Status: DC

## 2019-01-21 MED ORDER — VITAMIN C 500 MG PO TABS
500.0000 mg | ORAL_TABLET | Freq: Every day | ORAL | Status: DC
  Administered 2019-01-21: 500 mg via ORAL
  Filled 2019-01-21: qty 1

## 2019-01-21 MED ORDER — HYDROCODONE-ACETAMINOPHEN 7.5-325 MG PO TABS: 1.0000 | ORAL_TABLET | Freq: Four times a day (QID) | ORAL | 0 refills | Status: DC | PRN

## 2019-01-21 MED ORDER — CYCLOBENZAPRINE HCL 5 MG PO TABS: 5.0000 mg | ORAL_TABLET | Freq: Three times a day (TID) | ORAL | 0 refills | Status: DC | PRN

## 2019-01-21 MED FILL — VITAMIN C 500 MG TABLET: 500 | 30 days supply | Qty: 30 | Fill #0

## 2019-01-21 MED FILL — ACETAMINOPHEN 500MG XT STRE: 500 | 15 days supply | Qty: 30 | Fill #0

## 2019-01-21 MED FILL — VITAMIN D3 5,000 UNIT TAB: 125 MCG | 30 days supply | Qty: 30 | Fill #0

## 2019-01-21 MED FILL — HYDROCODON-APAP 7.5-325: 7.5-325 | 7 days supply | Qty: 56 | Fill #0

## 2019-01-21 MED FILL — DOK 100 MG CAPS: 100 | 5 days supply | Qty: 10 | Fill #0

## 2019-01-21 MED FILL — ENOXAPARIN 40 MG/0.4 ML SYR: 40 | 21 days supply | Qty: 8 | Fill #0

## 2019-01-21 MED FILL — CYCLOBENZAPRINE HCL 5 MG TA: 5 | 8 days supply | Qty: 50 | Fill #0

## 2019-01-21 NOTE — Discharge Instructions (Signed)
Orthopaedic Trauma Service Discharge Instructions   General Discharge Instructions  Orthopaedic Injuries:  Right tibia malunion, treated with intramedullary nailing and osteotomy  WEIGHT BEARING STATUS: Nonweightbearing right leg, use crutches to mobilize.  RANGE OF MOTION/ACTIVITY: Activity as tolerated while maintaining weightbearing restrictions.  Unrestricted range of motion right knee and ankle  Bone health: Vitamin D levels are low normal.  Would recommend taking 5000 IUs of vitamin D3 daily.  Wound Care: Daily wound care starting on 01/23/2019.  Please see below   Pain medications     Hydrocodone/APAP(Norco) 7.5/325: 1-2 pills every 6 hours as needed for moderate to severe pain      Tylenol 500 mg 1 tablet every 12 hours      Metaxalone (skelaxin) 400mg : 1 pill every 8 hours                           Continue to use Ice and elevate extremity as this helps with pain control.        Being active and participating in therapy is also great for pain control even though this may seem counter-intuitive    Discharge Wound Care Instructions  Do NOT apply any ointments, solutions or lotions to pin sites or surgical wounds.  These prevent needed drainage and even though solutions like hydrogen peroxide kill bacteria, they also damage cells lining the pin sites that help fight infection.  Applying lotions or ointments can keep the wounds moist and can cause them to breakdown and open up as well. This can increase the risk for infection. When in doubt call the office.  Surgical incisions should be dressed daily.  If any drainage is noted, use one layer of adaptic (oil emulsion gauze), then gauze, Kerlix, and an ace wrap from foot to above the knee The oil emulsion gauze can be obtained at a medical supply store  Once the incision is completely dry and without drainage, it may be left open to air out.  Showering may begin 36-48 hours later.  Cleaning gently with soap and  water.    DVT/PE prophylaxis: Lovenox 40 mg subcutaneous injection daily x 3 weeks  Diet: as you were eating previously.  Can use over the counter stool softeners and bowel preparations, such as Miralax, to help with bowel movements.  Narcotics can be constipating.  Be sure to drink plenty of fluids  PAIN MEDICATION USE AND EXPECTATIONS  You have likely been given narcotic medications to help control your pain.  After a traumatic event that results in an fracture (broken bone) with or without surgery, it is ok to use narcotic pain medications to help control one's pain.  We understand that everyone responds to pain differently and each individual patient will be evaluated on a regular basis for the continued need for narcotic medications. Ideally, narcotic medication use should last no more than 6-8 weeks (coinciding with fracture healing).   As a patient it is your responsibility as well to monitor narcotic medication use and report the amount and frequency you use these medications when you come to your office visit.   We would also advise that if you are using narcotic medications, you should take a dose prior to therapy to maximize you participation.  IF YOU ARE ON NARCOTIC MEDICATIONS IT IS NOT PERMISSIBLE TO OPERATE A MOTOR VEHICLE (MOTORCYCLE/CAR/TRUCK/MOPED) OR HEAVY MACHINERY DO NOT MIX NARCOTICS WITH OTHER CNS (CENTRAL NERVOUS SYSTEM) DEPRESSANTS SUCH AS ALCOHOL   STOP SMOKING OR  USING NICOTINE PRODUCTS!!!!  As discussed nicotine severely impairs your body's ability to heal surgical and traumatic wounds but also impairs bone healing.  Wounds and bone heal by forming microscopic blood vessels (angiogenesis) and nicotine is a vasoconstrictor (essentially, shrinks blood vessels).  Therefore, if vasoconstriction occurs to these microscopic blood vessels they essentially disappear and are unable to deliver necessary nutrients to the healing tissue.  This is one modifiable factor that you can  do to dramatically increase your chances of healing your injury.    (This means no smoking, no nicotine gum, patches, etc)  DO NOT USE NONSTEROIDAL ANTI-INFLAMMATORY DRUGS (NSAID'S)  Using products such as Advil (ibuprofen), Aleve (naproxen), Motrin (ibuprofen) for additional pain control during fracture healing can delay and/or prevent the healing response.  If you would like to take over the counter (OTC) medication, Tylenol (acetaminophen) is ok.  However, some narcotic medications that are given for pain control contain acetaminophen as well. Therefore, you should not exceed more than 4000 mg of tylenol in a day if you do not have liver disease.  Also note that there are may OTC medicines, such as cold medicines and allergy medicines that my contain tylenol as well.  If you have any questions about medications and/or interactions please ask your doctor/PA or your pharmacist.      ICE AND ELEVATE INJURED/OPERATIVE EXTREMITY  Using ice and elevating the injured extremity above your heart can help with swelling and pain control.  Icing in a pulsatile fashion, such as 20 minutes on and 20 minutes off, can be followed.    Do not place ice directly on skin. Make sure there is a barrier between to skin and the ice pack.    Using frozen items such as frozen peas works well as the conform nicely to the are that needs to be iced.  USE AN ACE WRAP OR TED HOSE FOR SWELLING CONTROL  In addition to icing and elevation, Ace wraps or TED hose are used to help limit and resolve swelling.  It is recommended to use Ace wraps or TED hose until you are informed to stop.    When using Ace Wraps start the wrapping distally (farthest away from the body) and wrap proximally (closer to the body)   Example: If you had surgery on your leg or thing and you do not have a splint on, start the ace wrap at the toes and work your way up to the thigh        If you had surgery on your upper extremity and do not have a splint on,  start the ace wrap at your fingers and work your way up to the upper arm  IF YOU ARE IN A SPLINT OR CAST DO NOT [place]   If your splint gets wet for any reason please contact the office immediately. You may shower in your splint or cast as long as you keep it dry.  This can be done by wrapping in a cast cover or garbage back (or similar)  Do Not stick any thing down your splint or cast such as pencils, money, or hangers to try and scratch yourself with.  If you feel itchy take benadryl as prescribed on the bottle for itching  IF YOU ARE IN A CAM BOOT (BLACK BOOT)  You may remove boot periodically. Perform daily dressing changes as noted below.  Wash the liner of the boot regularly and wear a sock when wearing the boot. It  is recommended that you sleep in the boot until told otherwise    Call office for the following:  Temperature greater than 101F  Persistent nausea and vomiting  Severe uncontrolled pain  Redness, tenderness, or signs of infection (pain, swelling, redness, odor or green/yellow discharge around the site)  Difficulty breathing, headache or visual disturbances  Hives  Persistent dizziness or light-headedness  Extreme fatigue  Any other questions or concerns you may have after discharge  In an emergency, call 911 or go to an Emergency Department at a nearby hospital    [place]: [phone]   VISIT OUR WEBSITE FOR ADDITIONAL INFORMATION: [email or web address]
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
## 2019-01-21 NOTE — Plan of Care (Signed)

## 2019-01-21 NOTE — Progress Notes (Signed)
Orthopaedic Trauma Service Progress Note  Patient ID: Daryl Cook MRN: [id number] DOB/AGE: Jan 27, 1956 63 y.o.  Subjective:  Doing well Pain improved after pain meds Has used percocet in the past but does not think it was all that effective  No additional issues   Wants to go home today    Review of Systems  Constitutional: Negative for chills and fever.  Respiratory: Negative for shortness of breath and wheezing.   Cardiovascular: Negative for chest pain and palpitations.  Gastrointestinal: Negative for nausea and vomiting.  Genitourinary: Negative for dysuria.    Objective:   VITALS:   Vitals:   01/20/19 2129 01/21/19 0021 01/21/19 0450 01/21/19 0756  BP: (!) 146/76 (!) 145/83 (!) 148/101 (!) 159/109  Pulse: 95 88 79 84  Resp: 16 18 16 18   Temp: 98.2 F (36.8 C) 98.3 F (36.8 C) (!) 97.4 F (36.3 C) 98.4 F (36.9 C)  TempSrc: Oral Oral Oral Oral  SpO2: 97% 94% 97% 96%  Weight:      Height:        Estimated body mass index is 25.06 kg/m as calculated from the following:   Height as of this encounter: 5\' 7"  (1.702 m).   Weight as of this encounter: 72.6 kg.   Intake/Output      11/19 0701 - 11/20 0700 11/20 0701 - 11/21 0700   P.O. 440 240   I.V. (mL/kg) 1546.8 (21.3)    IV Piggyback 248.3    Total Intake(mL/kg) 2235 (30.8) 240 (3.3)   Urine (mL/kg/hr) 1725 (1) 400 (2.2)   Blood 200    Total Output 1925 400   Net +310 -160          LABS  Results for orders placed or performed during the hospital encounter of 01/20/19 (from the past 24 hour(s))  CBC     Status: Abnormal   Collection Time: 01/21/19  3:38 AM  Result Value Ref Range   WBC 12.5 (H) 4.0 - 10.5 K/uL   RBC 4.09 (L) 4.22 - 5.81 MIL/uL   Hemoglobin 13.4 13.0 - 17.0 g/dL   HCT 40.2 39.0 - 52.0 %   MCV 98.3 80.0 - 100.0 fL   MCH 32.8 26.0 - 34.0 pg   MCHC 33.3 30.0 - 36.0 g/dL   RDW 12.1 11.5 - 15.5 %   Platelets 192 150 - 400 K/uL   nRBC 0.0 0.0 - 0.2 %     PHYSICAL EXAM:   Gen: Resting comfortably in bed, no acute distress, appears well Lungs: Unlabored, clear to auscultation Cardiac: Regular rate and rhythm Abd: Soft, nontender, nondistended, + bowel sounds Ext:       Right lower extremity  Dressings are clean, dry and intact  Extremity is warm   + DP pulse  Distal motor and sensory functions are intact  Swelling is controlled  No pain with passive stretching  Clinical alignment is improved  Assessment/Plan: 1 Day Post-Op   Principal Problem:   Closed fracture of tibia and fibula with malunion, right Active Problems:   History of hepatitis C   GERD   Restless legs syndrome   Anti-infectives (From admission, onward)   Start     Dose/Rate Route Frequency Ordered Stop   01/20/19 1445  ceFAZolin (ANCEF) IVPB 1 g/50 mL premix  1 g 100 mL/hr over 30 Minutes Intravenous Every 6 hours 01/20/19 1436 01/21/19 0301   01/20/19 0715  ceFAZolin (ANCEF) IVPB 2g/100 mL premix     2 g 200 mL/hr over 30 Minutes Intravenous On call to O.R. 01/20/19 [id number] 01/20/19 0953    .  POD/HD#: 52  63 year old male with chronic right tibia and fibula malunion s/p corrective osteotomy with intramedullary nailing right tibia  -Chronic right tibia tibia malunion, end-stage DJD right knee s/p osteotomy and intramedullary nailing right tibia  Nonweightbearing for 4 weeks or so, crutches for mobilization  Unrestricted range of motion right knee and ankle  Daily dressing changes started on 01/23/2019  Aggressive ice and elevation  PT and OT evaluations  Discharge home today  - Pain management:  Will change to hydrocodone/apap  Continue with regimen otherwise  Did review medication schedule with patient  - ABL anemia/Hemodynamics  Patient will hypertensive this morning  BPs look good yesterday  Recheck later this morning  - Medical issues   Home medications  - DVT/PE prophylaxis:   Lovenox for 21 days postop  - ID:   Perioperative antibiotics  - Metabolic Bone Disease:  Vitamin D is low normal.  Would recommend supplementation and will discharge on supplementation  - Activity:  Nonweightbearing right leg, use crutches to mobilize - FEN/GI prophylaxis/Foley/Lines:  Regular diet  - Dispo:  Therapy evaluations  Discharge later this evening  Follow-up with orthopedics in 10 to 14 days     Jari Pigg, PA-C [phone] (C) 01/21/2019, 9:30 AM  [facility] [place] [address] [phone] Domingo Sep (F)   After 6pm on weekdays please call office number to get in touch with on call provider or refer to [place] and look to see who is on call for the Sports Medicine Call Group which is listed under orthopaedics   On Weekends please call office number to get in touch with on call provider or refer to [place] and look to see who is on call for the Sports Medicine Call Group which is listed under orthopaedics
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
## 2019-02-09 NOTE — Op Note (Signed)
NAME: Daryl Cook, NABARRO MEDICAL RECORD [id number] ACCOUNT [id number] DATE OF BIRTH:Jul 18, 1955 FACILITY: [facility] LOCATION: [facility] PHYSICIAN:Lennyn Bellanca H. Farris Geiman, MD  OPERATIVE REPORT  DATE OF PROCEDURE:  01/20/2019  PREOPERATIVE DIAGNOSES: 1.  Right tibia and fibula nonunion. 2.  Right tibia and fibula varus alignment, 15 degrees. 3.  Right knee and ankle arthritis.  POSTOPERATIVE DIAGNOSES:   1.  Right tibia and fibula nonunion. 2.  Right tibia and fibula varus alignment, 15 degrees. 3.  Right knee and ankle arthritis.  PROCEDURES: 1.  Repair of right tibia malunion and nonunion with autografting. 2.  Intramedullary nailing of the right tibia.  SURGEON:  Myrene Galas, MD  ASSISTANT:  Montez Morita, PA-C.  ANESTHESIA:  General, supplemented with regional block.  COMPLICATIONS:  None.  TOURNIQUET:  None.  DISPOSITION:  To PACU.  CONDITION:  Stable.  INDICATIONS FOR PROCEDURE:   The patient is a very pleasant 63 year old male with a long history of varus deformity and a very large hypertrophic malaligned nonunion from a segmental tib-fib fracture sustained in the 1980s.  The patient is contemplating  knee replacement and wished for correction of his tibial deformity prior to undertaking that to maximize the life of that prosthesis after close consultation with his total joint surgeon, Dr. Dannielle Huh.  I discussed with the patient the risks and  benefits of attempted repair of this malaligned nonunion, including the possible need to perform an osteotomy of the fibula in addition to the tibia, the possibility of nonunion particularly if we are unable to obtain enough autograft locally, the  possibility of significant bleeding, nerve or vessel injury and need for further surgery, among others.  We also discussed potential for anterior knee pain, symptomatic hardware as a result of the expected additional need for intramedullary nailing to  further stabilize our deformity correction.   The patient acknowledged these risks and provided consent to proceed.  BRIEF SUMMARY OF PROCEDURE:  The patient was taken to the operating room where general anesthesia was induced.  Again, he received a preoperative block and antibiotics.  A chlorhexidine wash, Betadine scrub and paint was performed.  I brought in the  C-arm and according to my operative plan, proceeded to remove two 7 cm block of bone, 1 proximally and 1 distally.  I then performed a transverse osteotomy of the primary bone canal, which was difficult to identify.  We brought in the C-arm and using a  drill bit, first small and then larger, were able to obtain the correct trajectory.  During this process, we collected autograft for subsequent bone grafting of this nonunion repair.  We once establishing the canal proximally and distally, placed the  guidewire and reamed over this pin again to establish the correct trajectory down through the distal segment specifically, though the proximal segment was not easy either.  I was then able to line up these segments.  I then turned my attention to the  knee.  After placing on a radiolucent triangle, made a 3 cm incision extending proximally from the distal pole of the patella and a medial parapatellar retinacular incision and introducing the curved cannulated awl just medial to lateral tibial spine in  the center-center position of the proximal tibia.  This was followed by advancement of the guidewire across the osteotomy site and down into the distal plafond, measuring and then sequentially reaming before placing an 8 x 330 mm nail.  I placed 2 static  locks using perfect circle technique from medial to lateral and then  2 locks off the jig proximally.  Final images showed outstanding alignment, outstanding apposition of the tibial osteotomy site with restoration of what appeared to be near anatomic  alignment.  I furthermore debrided under direct visualization again with the microsagittal saw  and osteotome the hypertrophic malunion, nonunion along the distal medial fragment as well.  Montez Morita, PA-C, was present, assisting me throughout in this  very technically demanding case and was required to hold for exposure as well as to protect the neurovascular bundle and maintain the alignment during intramedullary nailing.  PROGNOSIS:  The patient will be touchdown weightbearing and may begin showering in 2 days with unrestricted range of motion of the knee and ankle.  I will see him back for removal of sutures in 2 weeks.  He will be on formal pharmacologic DVT prophylaxis  and then anticipate referring him back to see Dr. Sherlean Foot sometime 2-3 months after surgery to check and we are hopeful we will have some improvement in his knee symptoms with correction of the alignment.  VN/NUANCE  D:02/09/2019 T:02/09/2019 JOB:[id number]

## 2019-02-16 ENCOUNTER — Telehealth: Payer: Self-pay

## 2019-02-16 NOTE — Telephone Encounter (Signed)
Spoke to pt. He said the procedure went well. Lump is gone. Overall very happy with outcome.

## 2019-03-07 ENCOUNTER — Other Ambulatory Visit: Payer: Self-pay | Admitting: Internal Medicine

## 2019-03-07 NOTE — Telephone Encounter (Signed)
Last filled 01-12-19 #20 Last OV 12-21-18 Next OV 12-23-19 [facility]

## 2019-03-15 ENCOUNTER — Ambulatory Visit (INDEPENDENT_AMBULATORY_CARE_PROVIDER_SITE_OTHER): Payer: BC Managed Care – PPO | Admitting: *Deleted

## 2019-03-15 ENCOUNTER — Other Ambulatory Visit: Payer: Self-pay

## 2019-03-15 DIAGNOSIS — G8929 Other chronic pain: Secondary | ICD-10-CM | POA: Insufficient documentation

## 2019-03-15 DIAGNOSIS — Z23 Encounter for immunization: Secondary | ICD-10-CM

## 2019-03-15 NOTE — Progress Notes (Signed)
Per orders of Dr. Letvak, injection of Shingrix given by Atarah Cadogan M. Patient tolerated injection well.  

## 2019-05-06 ENCOUNTER — Other Ambulatory Visit: Payer: Self-pay | Admitting: Internal Medicine

## 2019-05-06 NOTE — Telephone Encounter (Signed)
Last filled 04-05-19 #60 Last OV 12-21-18 Next OV 12-23-19 [facility]

## 2019-05-12 NOTE — Progress Notes (Signed)
[id number] [address] [place] [address] Phone: [phone] Fax: [phone]  [facility] [place], [place] - [address] [place] [address] Phone: [phone] Fax: [phone]      Your procedure is scheduled on Tuesday, May 17, 2019.  Report to [facility] Main Entrance "A" at 6:00 A.M., and check in at the Admitting office.  Call this number if you have problems the morning of surgery:  [phone]  Call [phone] if you have any questions prior to your surgery date Monday-Friday 8am-4pm    Remember:  Do not eat or drink after midnight the night before your surgery    Take these medicines the morning of surgery with A SIP OF WATER: omeprazole (PRILOSEC) acetaminophen (TYLENOL) - if needed cetirizine (ZYRTEC) - if needed clonazePAM (KLONOPIN) - if needed  7 days prior to surgery STOP taking any Aspirin (unless otherwise instructed by your surgeon), Aleve, Naproxen, Ibuprofen, Motrin, Advil, Goody's, BC's, all herbal medications, fish oil, and all vitamins.    The Morning of Surgery  Do not wear jewelry.  Do not wear lotions, powders, colognes, or deodorant  Do not shave 48 hours prior to surgery.  Men may shave face and neck.  Do not bring valuables to the hospital.  [facility] is not responsible for any belongings or valuables.  If you are a smoker, DO NOT Smoke 24 hours prior to surgery  If you wear a CPAP at night please bring your mask the morning of surgery   Remember that you must have someone to transport you home after your surgery, and remain with you for 24 hours if you are discharged the same day.   Please bring cases for contacts, glasses, hearing aids, dentures or bridgework because it cannot be worn into surgery.    Leave your suitcase in the car.  After surgery it may be brought to your room.  For patients admitted to the  hospital, discharge time will be determined by your treatment team.  Patients discharged the day of surgery will not be allowed to drive home.    Special instructions:   [place]- Preparing For Surgery  Before surgery, you can play an important role. Because skin is not sterile, your skin needs to be as free of germs as possible. You can reduce the number of germs on your skin by washing with CHG (chlorahexidine gluconate) Soap before surgery.  CHG is an antiseptic cleaner which kills germs and bonds with the skin to continue killing germs even after washing.    Oral Hygiene is also important to reduce your risk of infection.  Remember - BRUSH YOUR TEETH THE MORNING OF SURGERY WITH YOUR REGULAR TOOTHPASTE  Please do not use if you have an allergy to CHG or antibacterial soaps. If your skin becomes reddened/irritated stop using the CHG.  Do not shave (including legs and underarms) for at least 48 hours prior to first CHG shower. It is OK to shave your face.  Please follow these instructions carefully.   1. Shower the NIGHT BEFORE SURGERY and the MORNING OF SURGERY with CHG Soap.   2. If you chose to wash your hair, wash your hair first as usual with your normal shampoo.  3. After you shampoo, rinse your hair and body thoroughly to remove the shampoo.  4. Use CHG as you would any other liquid soap. You can apply CHG directly to  the skin and wash gently with a scrungie or a clean washcloth.   5. Apply the CHG Soap to your body ONLY FROM THE NECK DOWN.  Do not use on open wounds or open sores. Avoid contact with your eyes, ears, mouth and genitals (private parts). Wash Face and genitals (private parts)  with your normal soap.   6. Wash thoroughly, paying special attention to the area where your surgery will be performed.  7. Thoroughly rinse your body with warm water from the neck down.  8. DO NOT shower/wash with your normal soap after using and rinsing off the CHG Soap.  9. Pat  yourself dry with a CLEAN TOWEL.  10. Wear CLEAN PAJAMAS to bed the night before surgery, wear comfortable clothes the morning of surgery  11. Place CLEAN SHEETS on your bed the night of your first shower and DO NOT SLEEP WITH PETS.    Day of Surgery:  Please shower the morning of surgery with the CHG soap Do not apply any deodorants/lotions. Please wear clean clothes to the hospital/surgery center.   Remember to brush your teeth WITH YOUR REGULAR TOOTHPASTE.   Please read over the following fact sheets that you were given.

## 2019-05-13 ENCOUNTER — Encounter (HOSPITAL_COMMUNITY)
Admission: RE | Admit: 2019-05-13 | Discharge: 2019-05-13 | Disposition: A | Discharge status: Home / Self Care | Payer: BC Managed Care – PPO | Source: Ambulatory Visit | Attending: Orthopedic Surgery | Admitting: Orthopedic Surgery

## 2019-05-13 ENCOUNTER — Ambulatory Visit (HOSPITAL_COMMUNITY)
Admission: RE | Admit: 2019-05-13 | Discharge: 2019-05-13 | Disposition: A | Discharge status: Home / Self Care | Payer: BC Managed Care – PPO | Source: Ambulatory Visit | Attending: Orthopedic Surgery | Admitting: Orthopedic Surgery

## 2019-05-13 ENCOUNTER — Encounter (HOSPITAL_COMMUNITY): Payer: Self-pay

## 2019-05-13 ENCOUNTER — Other Ambulatory Visit (HOSPITAL_COMMUNITY)
Admission: RE | Admit: 2019-05-13 | Discharge: 2019-05-13 | Disposition: A | Discharge status: Home / Self Care | Payer: BC Managed Care – PPO | Source: Ambulatory Visit | Attending: Orthopedic Surgery | Admitting: Orthopedic Surgery

## 2019-05-13 ENCOUNTER — Other Ambulatory Visit: Payer: Self-pay

## 2019-05-13 DIAGNOSIS — Z87891 Personal history of nicotine dependence: Secondary | ICD-10-CM | POA: Diagnosis not present

## 2019-05-13 DIAGNOSIS — Z01818 Encounter for other preprocedural examination: Secondary | ICD-10-CM | POA: Insufficient documentation

## 2019-05-13 DIAGNOSIS — Z20822 Contact with and (suspected) exposure to covid-19: Secondary | ICD-10-CM | POA: Diagnosis not present

## 2019-05-13 LAB — CBC WITH DIFFERENTIAL/PLATELET
Abs Immature Granulocytes: 0.01 10*3/uL (ref 0.00–0.07)
Basophils Absolute: 0.1 10*3/uL (ref 0.0–0.1)
Basophils Relative: 1 %
Eosinophils Absolute: 0.1 10*3/uL (ref 0.0–0.5)
Eosinophils Relative: 2 %
HCT: 46.3 % (ref 39.0–52.0)
Hemoglobin: 15.4 g/dL (ref 13.0–17.0)
Immature Granulocytes: 0 %
Lymphocytes Relative: 28 %
Lymphs Abs: 1.9 10*3/uL (ref 0.7–4.0)
MCH: 30.9 pg (ref 26.0–34.0)
MCHC: 33.3 g/dL (ref 30.0–36.0)
MCV: 93 fL (ref 80.0–100.0)
Monocytes Absolute: 0.8 10*3/uL (ref 0.1–1.0)
Monocytes Relative: 12 %
Neutro Abs: 3.9 10*3/uL (ref 1.7–7.7)
Neutrophils Relative %: 57 %
Platelets: 238 10*3/uL (ref 150–400)
RBC: 4.98 MIL/uL (ref 4.22–5.81)
RDW: 13 % (ref 11.5–15.5)
WBC: 6.8 10*3/uL (ref 4.0–10.5)
nRBC: 0 % (ref 0.0–0.2)

## 2019-05-13 LAB — PROTIME-INR
INR: 1 (ref 0.8–1.2)
Prothrombin Time: 13.3 seconds (ref 11.4–15.2)

## 2019-05-13 LAB — RAPID URINE DRUG SCREEN, HOSP PERFORMED
Amphetamines: NOT DETECTED
Barbiturates: NOT DETECTED
Benzodiazepines: NOT DETECTED
Cocaine: NOT DETECTED
Opiates: NOT DETECTED
Tetrahydrocannabinol: NOT DETECTED

## 2019-05-13 LAB — SEDIMENTATION RATE: Sed Rate: 3 mm/hr (ref 0–16)

## 2019-05-13 LAB — SARS CORONAVIRUS 2 (TAT 6-24 HRS): SARS Coronavirus 2: NEGATIVE

## 2019-05-13 LAB — SURGICAL PCR SCREEN
MRSA, PCR: NEGATIVE
Staphylococcus aureus: NEGATIVE

## 2019-05-13 LAB — URINALYSIS, ROUTINE W REFLEX MICROSCOPIC
Bacteria, UA: NONE SEEN
Bilirubin Urine: NEGATIVE
Glucose, UA: NEGATIVE mg/dL
Hgb urine dipstick: NEGATIVE
Ketones, ur: NEGATIVE mg/dL
Nitrite: NEGATIVE
Protein, ur: NEGATIVE mg/dL
Specific Gravity, Urine: 1.016 (ref 1.005–1.030)
pH: 5 (ref 5.0–8.0)

## 2019-05-13 LAB — ABO/RH: ABO/RH(D): O POS

## 2019-05-13 LAB — TYPE AND SCREEN
ABO/RH(D): O POS
Antibody Screen: NEGATIVE

## 2019-05-13 LAB — COMPREHENSIVE METABOLIC PANEL
ALT: 20 U/L (ref 0–44)
AST: 24 U/L (ref 15–41)
Albumin: 4.5 g/dL (ref 3.5–5.0)
Alkaline Phosphatase: 55 U/L (ref 38–126)
Anion gap: 11 (ref 5–15)
BUN: 15 mg/dL (ref 8–23)
CO2: 25 mmol/L (ref 22–32)
Calcium: 9.7 mg/dL (ref 8.9–10.3)
Chloride: 103 mmol/L (ref 98–111)
Creatinine, Ser: 1.07 mg/dL (ref 0.61–1.24)
GFR calc Af Amer: >60 mL/min (ref >60)
GFR calc non Af Amer: >60 mL/min (ref >60)
Glucose, Bld: 86 mg/dL (ref 70–99)
Potassium: 4.2 mmol/L (ref 3.5–5.1)
Sodium: 139 mmol/L (ref 135–145)
Total Bilirubin: 1.2 mg/dL (ref 0.3–1.2)
Total Protein: 7.4 g/dL (ref 6.5–8.1)

## 2019-05-13 LAB — C-REACTIVE PROTEIN: CRP: 0.9 mg/dL (ref <1.0)

## 2019-05-13 LAB — VITAMIN D 25 HYDROXY (VIT D DEFICIENCY, FRACTURES): Vit D, 25-Hydroxy: 33.7 ng/mL (ref 30–100)

## 2019-05-13 LAB — APTT: aPTT: 29 seconds (ref 24–36)

## 2019-05-13 LAB — PREALBUMIN: Prealbumin: 36.4 mg/dL (ref 18–38)

## 2019-05-13 NOTE — Progress Notes (Signed)
Notified Ainsley Spinner, PA about pt's abnormal UA lab results.

## 2019-05-13 NOTE — Progress Notes (Signed)
PCP - Dr. Viviana Simpler Cardiologist - Denies  PPM/ICD - Denies  Chest x-ray - 05/13/19 EKG - 05/13/19 Stress Test - Denies ECHO - Denies Cardiac Cath - Denies  Pt denies being diabetic.  Sleep Study - denies  Blood Thinner Instructions: N/A Aspirin Instructions: N/A  ERAS Protcol - No   COVID TEST- 05/13/19   Coronavirus Screening  Have you experienced the following symptoms:  Cough yes/no: No Fever (>100.69F)  yes/no: No Runny nose yes/no: No Sore throat yes/no: No Difficulty breathing/shortness of breath  yes/no: No  Have you or a family member traveled in the last 14 days and where? yes/no: No   If the patient indicates "YES" to the above questions, their PAT will be rescheduled to limit the exposure to others and, the surgeon will be notified. THE PATIENT WILL NEED TO BE ASYMPTOMATIC FOR 14 DAYS.   If the patient is not experiencing any of these symptoms, the PAT nurse will instruct them to NOT bring anyone with them to their appointment since they may have these symptoms or traveled as well.   Please remind your patients and families that hospital visitation restrictions are in effect and the importance of the restrictions.     Anesthesia review: Yes, borderline EKG  Patient denies shortness of breath, fever, cough and chest pain at PAT appointment   All instructions explained to the patient, with a verbal understanding of the material. Patient agrees to go over the instructions while at home for a better understanding. Patient also instructed to self quarantine after being tested for COVID-19. The opportunity to ask questions was provided.

## 2019-05-16 NOTE — Anesthesia Preprocedure Evaluation (Addendum)
Anesthesia Evaluation  Patient identified by MRN, date of birth, ID band Patient awake    Reviewed: Allergy & Precautions, NPO status , Patient's Chart, lab work & pertinent test results  History of Anesthesia Complications Negative for: history of anesthetic complications  Airway Mallampati: II  TM Distance: >3 FB Neck ROM: Full    Dental no notable dental hx.    Pulmonary former smoker,    Pulmonary exam normal        Cardiovascular negative cardio ROS Normal cardiovascular exam     Neuro/Psych negative neurological ROS  negative psych ROS   GI/Hepatic GERD  Medicated and Controlled,(+) Hepatitis -, C  Endo/Other  negative endocrine ROS  Renal/GU negative Renal ROS  negative genitourinary   Musculoskeletal  (+) Arthritis ,   Abdominal   Peds  Hematology negative hematology ROS (+)   Anesthesia Other Findings Day of surgery medications reviewed with patient.  Reproductive/Obstetrics negative OB ROS                            Anesthesia Physical Anesthesia Plan  ASA: II  Anesthesia Plan: General   Post-op Pain Management: GA combined w/ Regional for post-op pain   Induction: Intravenous  PONV Risk Score and Plan: 3 and Treatment may vary due to age or medical condition, Ondansetron, Dexamethasone and Midazolam  Airway Management Planned: Oral ETT  Additional Equipment:   Intra-op Plan:   Post-operative Plan: Extubation in OR  Informed Consent: I have reviewed the patients History and Physical, chart, labs and discussed the procedure including the risks, benefits and alternatives for the proposed anesthesia with the patient or authorized representative who has indicated his/her understanding and acceptance.     Dental advisory given  Plan Discussed with: CRNA  Anesthesia Plan Comments:        Anesthesia Quick Evaluation
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
## 2019-05-17 ENCOUNTER — Inpatient Hospital Stay (HOSPITAL_COMMUNITY)
Admission: RE | Admit: 2019-05-17 | Discharge: 2019-05-19 | DRG: 493 | Disposition: A | Discharge status: Home / Self Care | Payer: BC Managed Care – PPO | Attending: Orthopedic Surgery | Admitting: Orthopedic Surgery

## 2019-05-17 ENCOUNTER — Encounter (HOSPITAL_COMMUNITY): Payer: Self-pay | Admitting: Orthopedic Surgery

## 2019-05-17 ENCOUNTER — Inpatient Hospital Stay (HOSPITAL_COMMUNITY): Payer: BC Managed Care – PPO | Admitting: Emergency Medicine

## 2019-05-17 ENCOUNTER — Encounter (HOSPITAL_COMMUNITY)
Admission: RE | Disposition: A | Discharge status: Home / Self Care | Payer: Self-pay | Source: Home / Self Care | Attending: Orthopedic Surgery

## 2019-05-17 ENCOUNTER — Other Ambulatory Visit: Payer: Self-pay

## 2019-05-17 ENCOUNTER — Inpatient Hospital Stay (HOSPITAL_COMMUNITY): Payer: BC Managed Care – PPO

## 2019-05-17 ENCOUNTER — Inpatient Hospital Stay (HOSPITAL_COMMUNITY): Payer: BC Managed Care – PPO | Admitting: Anesthesiology

## 2019-05-17 DIAGNOSIS — Z8249 Family history of ischemic heart disease and other diseases of the circulatory system: Secondary | ICD-10-CM

## 2019-05-17 DIAGNOSIS — D62 Acute posthemorrhagic anemia: Secondary | ICD-10-CM | POA: Diagnosis not present

## 2019-05-17 DIAGNOSIS — Z87891 Personal history of nicotine dependence: Secondary | ICD-10-CM

## 2019-05-17 DIAGNOSIS — S82201K Unspecified fracture of shaft of right tibia, subsequent encounter for closed fracture with nonunion: Principal | ICD-10-CM

## 2019-05-17 DIAGNOSIS — N529 Male erectile dysfunction, unspecified: Secondary | ICD-10-CM | POA: Diagnosis present

## 2019-05-17 DIAGNOSIS — Z9049 Acquired absence of other specified parts of digestive tract: Secondary | ICD-10-CM | POA: Diagnosis not present

## 2019-05-17 DIAGNOSIS — E8889 Other specified metabolic disorders: Secondary | ICD-10-CM | POA: Diagnosis present

## 2019-05-17 DIAGNOSIS — Z8262 Family history of osteoporosis: Secondary | ICD-10-CM | POA: Diagnosis not present

## 2019-05-17 DIAGNOSIS — M1711 Unilateral primary osteoarthritis, right knee: Secondary | ICD-10-CM | POA: Diagnosis present

## 2019-05-17 DIAGNOSIS — G2581 Restless legs syndrome: Secondary | ICD-10-CM | POA: Diagnosis present

## 2019-05-17 DIAGNOSIS — K219 Gastro-esophageal reflux disease without esophagitis: Secondary | ICD-10-CM | POA: Diagnosis present

## 2019-05-17 DIAGNOSIS — T84126D Displacement of internal fixation device of bone of right lower leg, subsequent encounter: Secondary | ICD-10-CM

## 2019-05-17 DIAGNOSIS — Z79899 Other long term (current) drug therapy: Secondary | ICD-10-CM | POA: Diagnosis not present

## 2019-05-17 DIAGNOSIS — E785 Hyperlipidemia, unspecified: Secondary | ICD-10-CM | POA: Diagnosis present

## 2019-05-17 DIAGNOSIS — B182 Chronic viral hepatitis C: Secondary | ICD-10-CM | POA: Diagnosis present

## 2019-05-17 DIAGNOSIS — S82201P Unspecified fracture of shaft of right tibia, subsequent encounter for closed fracture with malunion: Secondary | ICD-10-CM | POA: Diagnosis present

## 2019-05-17 DIAGNOSIS — Z419 Encounter for procedure for purposes other than remedying health state, unspecified: Secondary | ICD-10-CM

## 2019-05-17 HISTORY — PX: TIBIA HARDWARE REMOVAL: SHX2515

## 2019-05-17 HISTORY — PX: HARDWARE REMOVAL: SHX979

## 2019-05-17 HISTORY — PX: TIBIA IM NAIL INSERTION: SHX2516

## 2019-05-17 HISTORY — PX: IM NAILING TIBIA: SUR734

## 2019-05-17 HISTORY — PX: ILIAC CREST BONE GRAFT: SHX1787

## 2019-05-17 HISTORY — PX: HARVEST BONE GRAFT: SHX377

## 2019-05-17 LAB — CBC
HCT: 39.9 % (ref 39.0–52.0)
Hemoglobin: 13.1 g/dL (ref 13.0–17.0)
MCH: 30.8 pg (ref 26.0–34.0)
MCHC: 32.8 g/dL (ref 30.0–36.0)
MCV: 93.7 fL (ref 80.0–100.0)
Platelets: 216 10*3/uL (ref 150–400)
RBC: 4.26 MIL/uL (ref 4.22–5.81)
RDW: 13 % (ref 11.5–15.5)
WBC: 9.6 10*3/uL (ref 4.0–10.5)
nRBC: 0 % (ref 0.0–0.2)

## 2019-05-17 LAB — CREATININE, SERUM
Creatinine, Ser: 0.97 mg/dL (ref 0.61–1.24)
GFR calc Af Amer: >60 mL/min (ref >60)
GFR calc non Af Amer: >60 mL/min (ref >60)

## 2019-05-17 SURGERY — REMOVAL, HARDWARE
Anesthesia: General | Site: Leg Upper | Laterality: Right

## 2019-05-17 MED ORDER — LORATADINE 10 MG PO TABS
10.0000 mg | ORAL_TABLET | Freq: Every day | ORAL | Status: DC
  Administered 2019-05-17 – 2019-05-19 (×3): 10 mg via ORAL
  Filled 2019-05-17 (×3): qty 1

## 2019-05-17 MED ORDER — MIDAZOLAM HCL 2 MG/2ML IJ SOLN: 1.0000 mg | Freq: Once | INTRAMUSCULAR | Status: AC

## 2019-05-17 MED ORDER — DEXAMETHASONE SODIUM PHOSPHATE 10 MG/ML IJ SOLN
INTRAMUSCULAR | Status: DC | PRN
  Administered 2019-05-17: 10 mg via INTRAVENOUS

## 2019-05-17 MED ORDER — PROPOFOL 10 MG/ML IV BOLUS
INTRAVENOUS | Status: AC
  Filled 2019-05-17: qty 20

## 2019-05-17 MED ORDER — ONDANSETRON HCL 4 MG/2ML IJ SOLN
INTRAMUSCULAR | Status: AC
  Filled 2019-05-17: qty 2

## 2019-05-17 MED ORDER — LACTATED RINGERS IV SOLN: INTRAVENOUS | Status: DC

## 2019-05-17 MED ORDER — BUPIVACAINE LIPOSOME 1.3 % IJ SUSP
INTRAMUSCULAR | Status: DC | PRN
  Administered 2019-05-17 (×2): 5 mL via PERINEURAL

## 2019-05-17 MED ORDER — EPHEDRINE SULFATE-NACL 50-0.9 MG/10ML-% IV SOSY
PREFILLED_SYRINGE | INTRAVENOUS | Status: DC | PRN
  Administered 2019-05-17 (×2): 10 mg via INTRAVENOUS

## 2019-05-17 MED ORDER — OXYCODONE HCL 5 MG/5ML PO SOLN: 5.0000 mg | Freq: Once | ORAL | Status: DC | PRN

## 2019-05-17 MED ORDER — ONDANSETRON HCL 4 MG/2ML IJ SOLN
INTRAMUSCULAR | Status: DC | PRN
  Administered 2019-05-17: 4 mg via INTRAVENOUS

## 2019-05-17 MED ORDER — ROCURONIUM BROMIDE 50 MG/5ML IV SOSY
PREFILLED_SYRINGE | INTRAVENOUS | Status: DC | PRN
  Administered 2019-05-17 (×3): 50 mg via INTRAVENOUS

## 2019-05-17 MED ORDER — ENOXAPARIN SODIUM 40 MG/0.4ML ~~LOC~~ SOLN
40.0000 mg | SUBCUTANEOUS | Status: DC
  Administered 2019-05-18 – 2019-05-19 (×2): 40 mg via SUBCUTANEOUS
  Filled 2019-05-17 (×2): qty 0.4

## 2019-05-17 MED ORDER — CEFAZOLIN SODIUM-DEXTROSE 2-4 GM/100ML-% IV SOLN
INTRAVENOUS | Status: AC
  Filled 2019-05-17: qty 100

## 2019-05-17 MED ORDER — POVIDONE-IODINE 10 % EX SWAB
2.0000 "application " | Freq: Once | CUTANEOUS | Status: AC
  Administered 2019-05-17: 2 via TOPICAL

## 2019-05-17 MED ORDER — MIDAZOLAM HCL 2 MG/2ML IJ SOLN
INTRAMUSCULAR | Status: AC
  Administered 2019-05-17: 1 mg via INTRAVENOUS
  Filled 2019-05-17: qty 2

## 2019-05-17 MED ORDER — MIDAZOLAM HCL 5 MG/5ML IJ SOLN
INTRAMUSCULAR | Status: DC | PRN
  Administered 2019-05-17: 2 mg via INTRAVENOUS

## 2019-05-17 MED ORDER — GABAPENTIN 300 MG PO CAPS: 300.0000 mg | ORAL_CAPSULE | Freq: Once | ORAL | Status: AC

## 2019-05-17 MED ORDER — KETOROLAC TROMETHAMINE 15 MG/ML IJ SOLN
15.0000 mg | Freq: Three times a day (TID) | INTRAMUSCULAR | Status: AC
  Administered 2019-05-17 – 2019-05-19 (×6): 15 mg via INTRAVENOUS
  Filled 2019-05-17 (×6): qty 1

## 2019-05-17 MED ORDER — ACETAMINOPHEN 325 MG PO TABS: 325.0000 mg | ORAL_TABLET | Freq: Four times a day (QID) | ORAL | Status: DC | PRN

## 2019-05-17 MED ORDER — CEFAZOLIN SODIUM-DEXTROSE 2-4 GM/100ML-% IV SOLN
2.0000 g | INTRAVENOUS | Status: AC
  Administered 2019-05-17: 2 g via INTRAVENOUS

## 2019-05-17 MED ORDER — FENTANYL CITRATE (PF) 100 MCG/2ML IJ SOLN: 50.0000 ug | Freq: Once | INTRAMUSCULAR | Status: AC

## 2019-05-17 MED ORDER — PANTOPRAZOLE SODIUM 40 MG PO TBEC
40.0000 mg | DELAYED_RELEASE_TABLET | Freq: Every day | ORAL | Status: DC
  Administered 2019-05-17 – 2019-05-19 (×3): 40 mg via ORAL
  Filled 2019-05-17 (×3): qty 1

## 2019-05-17 MED ORDER — HEMOSTATIC AGENTS (NO CHARGE) OPTIME
TOPICAL | Status: DC | PRN
  Administered 2019-05-17 (×2): 1

## 2019-05-17 MED ORDER — ONDANSETRON HCL 4 MG/2ML IJ SOLN: 4.0000 mg | Freq: Four times a day (QID) | INTRAMUSCULAR | Status: DC | PRN

## 2019-05-17 MED ORDER — HYDROCODONE-ACETAMINOPHEN 7.5-325 MG PO TABS
1.0000 | ORAL_TABLET | ORAL | Status: DC | PRN
  Administered 2019-05-17: 2 via ORAL
  Filled 2019-05-17: qty 1
  Filled 2019-05-17: qty 2

## 2019-05-17 MED ORDER — DEXAMETHASONE SODIUM PHOSPHATE 10 MG/ML IJ SOLN
INTRAMUSCULAR | Status: AC
  Filled 2019-05-17: qty 1

## 2019-05-17 MED ORDER — POLYETHYLENE GLYCOL 3350 17 G PO PACK
17.0000 g | PACK | Freq: Every day | ORAL | Status: DC
  Administered 2019-05-18: 17 g via ORAL
  Filled 2019-05-17 (×2): qty 1

## 2019-05-17 MED ORDER — ACETAMINOPHEN 500 MG PO TABS: 1000.0000 mg | ORAL_TABLET | Freq: Once | ORAL | Status: AC

## 2019-05-17 MED ORDER — FENTANYL CITRATE (PF) 250 MCG/5ML IJ SOLN
INTRAMUSCULAR | Status: AC
  Filled 2019-05-17: qty 5

## 2019-05-17 MED ORDER — DOCUSATE SODIUM 100 MG PO CAPS
100.0000 mg | ORAL_CAPSULE | Freq: Two times a day (BID) | ORAL | Status: DC
  Administered 2019-05-17 – 2019-05-19 (×4): 100 mg via ORAL
  Filled 2019-05-17 (×4): qty 1

## 2019-05-17 MED ORDER — PHENYLEPHRINE 40 MCG/ML (10ML) SYRINGE FOR IV PUSH (FOR BLOOD PRESSURE SUPPORT)
PREFILLED_SYRINGE | INTRAVENOUS | Status: DC | PRN
  Administered 2019-05-17: 120 ug via INTRAVENOUS

## 2019-05-17 MED ORDER — POTASSIUM CHLORIDE IN NACL 20-0.9 MEQ/L-% IV SOLN
INTRAVENOUS | Status: DC
  Filled 2019-05-17 (×2): qty 1000

## 2019-05-17 MED ORDER — LIDOCAINE 2% (20 MG/ML) 5 ML SYRINGE
INTRAMUSCULAR | Status: AC
  Filled 2019-05-17: qty 5

## 2019-05-17 MED ORDER — ACETAMINOPHEN 500 MG PO TABS
ORAL_TABLET | ORAL | Status: AC
  Administered 2019-05-17: 1000 mg via ORAL
  Filled 2019-05-17: qty 2

## 2019-05-17 MED ORDER — BUPIVACAINE-EPINEPHRINE (PF) 0.5% -1:200000 IJ SOLN
INTRAMUSCULAR | Status: DC | PRN
  Administered 2019-05-17: 10 mL via PERINEURAL
  Administered 2019-05-17: 20 mL via PERINEURAL

## 2019-05-17 MED ORDER — LIDOCAINE 2% (20 MG/ML) 5 ML SYRINGE
INTRAMUSCULAR | Status: DC | PRN
  Administered 2019-05-17: 80 mg via INTRAVENOUS

## 2019-05-17 MED ORDER — PHENYLEPHRINE HCL-NACL 10-0.9 MG/250ML-% IV SOLN
INTRAVENOUS | Status: DC | PRN
  Administered 2019-05-17: 50 ug/min via INTRAVENOUS

## 2019-05-17 MED ORDER — PROPOFOL 10 MG/ML IV BOLUS
INTRAVENOUS | Status: DC | PRN
  Administered 2019-05-17: 140 mg via INTRAVENOUS

## 2019-05-17 MED ORDER — GABAPENTIN 300 MG PO CAPS
ORAL_CAPSULE | ORAL | Status: AC
  Administered 2019-05-17: 300 mg via ORAL
  Filled 2019-05-17: qty 1

## 2019-05-17 MED ORDER — EPHEDRINE 5 MG/ML INJ
INTRAVENOUS | Status: AC
  Filled 2019-05-17: qty 10

## 2019-05-17 MED ORDER — SODIUM CHLORIDE (PF) 0.9 % IJ SOLN
INTRAMUSCULAR | Status: DC | PRN
  Administered 2019-05-17: 9 mL

## 2019-05-17 MED ORDER — ACETAMINOPHEN 500 MG PO TABS
500.0000 mg | ORAL_TABLET | Freq: Three times a day (TID) | ORAL | Status: AC
  Administered 2019-05-17 – 2019-05-18 (×3): 500 mg via ORAL
  Filled 2019-05-17 (×3): qty 1

## 2019-05-17 MED ORDER — PROMETHAZINE HCL 25 MG/ML IJ SOLN: 6.2500 mg | INTRAMUSCULAR | Status: DC | PRN

## 2019-05-17 MED ORDER — OXYCODONE HCL 5 MG PO TABS: 5.0000 mg | ORAL_TABLET | Freq: Once | ORAL | Status: DC | PRN

## 2019-05-17 MED ORDER — VITAMIN D 25 MCG (1000 UNIT) PO TABS
1000.0000 [IU] | ORAL_TABLET | Freq: Every day | ORAL | Status: DC
  Administered 2019-05-17 – 2019-05-19 (×3): 1000 [IU] via ORAL
  Filled 2019-05-17 (×6): qty 1

## 2019-05-17 MED ORDER — CHLORHEXIDINE GLUCONATE 4 % EX LIQD: 60.0000 mL | Freq: Once | CUTANEOUS | Status: DC

## 2019-05-17 MED ORDER — ASCORBIC ACID 500 MG PO TABS
500.0000 mg | ORAL_TABLET | Freq: Every day | ORAL | Status: DC
  Administered 2019-05-17 – 2019-05-19 (×3): 500 mg via ORAL
  Filled 2019-05-17 (×3): qty 1

## 2019-05-17 MED ORDER — ONDANSETRON HCL 4 MG PO TABS: 4.0000 mg | ORAL_TABLET | Freq: Four times a day (QID) | ORAL | Status: DC | PRN

## 2019-05-17 MED ORDER — METOCLOPRAMIDE HCL 5 MG PO TABS: 5.0000 mg | ORAL_TABLET | Freq: Three times a day (TID) | ORAL | Status: DC | PRN

## 2019-05-17 MED ORDER — CEFAZOLIN SODIUM-DEXTROSE 1-4 GM/50ML-% IV SOLN
1.0000 g | Freq: Four times a day (QID) | INTRAVENOUS | Status: AC
  Administered 2019-05-17 – 2019-05-18 (×3): 1 g via INTRAVENOUS
  Filled 2019-05-17 (×3): qty 50

## 2019-05-17 MED ORDER — HYDROMORPHONE HCL 1 MG/ML IJ SOLN
0.2500 mg | INTRAMUSCULAR | Status: DC | PRN
  Administered 2019-05-17 (×2): 0.5 mg via INTRAVENOUS

## 2019-05-17 MED ORDER — BUPIVACAINE LIPOSOME 1.3 % IJ SUSP
10.0000 mL | Freq: Once | INTRAMUSCULAR | Status: AC
  Administered 2019-05-17: 9 mL
  Filled 2019-05-17: qty 10

## 2019-05-17 MED ORDER — FENTANYL CITRATE (PF) 100 MCG/2ML IJ SOLN
INTRAMUSCULAR | Status: AC
  Administered 2019-05-17: 50 ug via INTRAVENOUS
  Filled 2019-05-17: qty 2

## 2019-05-17 MED ORDER — TRAMADOL HCL 50 MG PO TABS
50.0000 mg | ORAL_TABLET | Freq: Three times a day (TID) | ORAL | Status: DC
  Administered 2019-05-17 – 2019-05-19 (×6): 50 mg via ORAL
  Filled 2019-05-17 (×6): qty 1

## 2019-05-17 MED ORDER — ROCURONIUM BROMIDE 10 MG/ML (PF) SYRINGE
PREFILLED_SYRINGE | INTRAVENOUS | Status: AC
  Filled 2019-05-17: qty 10

## 2019-05-17 MED ORDER — 0.9 % SODIUM CHLORIDE (POUR BTL) OPTIME
TOPICAL | Status: DC | PRN
  Administered 2019-05-17: 10:00:00 1000 mL

## 2019-05-17 MED ORDER — MIDAZOLAM HCL 2 MG/2ML IJ SOLN
INTRAMUSCULAR | Status: AC
  Filled 2019-05-17: qty 2

## 2019-05-17 MED ORDER — FENTANYL CITRATE (PF) 100 MCG/2ML IJ SOLN
INTRAMUSCULAR | Status: DC | PRN
  Administered 2019-05-17: 25 ug via INTRAVENOUS
  Administered 2019-05-17 (×3): 50 ug via INTRAVENOUS

## 2019-05-17 MED ORDER — GABAPENTIN 300 MG PO CAPS
900.0000 mg | ORAL_CAPSULE | Freq: Every day | ORAL | Status: DC
  Administered 2019-05-17 – 2019-05-18 (×2): 900 mg via ORAL
  Filled 2019-05-17 (×2): qty 3

## 2019-05-17 MED ORDER — HYDROMORPHONE HCL 1 MG/ML IJ SOLN
INTRAMUSCULAR | Status: AC
  Filled 2019-05-17: qty 1

## 2019-05-17 MED ORDER — METOCLOPRAMIDE HCL 5 MG/ML IJ SOLN: 5.0000 mg | Freq: Three times a day (TID) | INTRAMUSCULAR | Status: DC | PRN

## 2019-05-17 MED ORDER — HYDROCODONE-ACETAMINOPHEN 5-325 MG PO TABS
1.0000 | ORAL_TABLET | ORAL | Status: DC | PRN
  Administered 2019-05-18 (×2): 2 via ORAL
  Administered 2019-05-18: 1 via ORAL
  Administered 2019-05-18 – 2019-05-19 (×3): 2 via ORAL
  Filled 2019-05-17: qty 1
  Filled 2019-05-17 (×5): qty 2

## 2019-05-17 MED ORDER — ROCURONIUM BROMIDE 10 MG/ML (PF) SYRINGE
PREFILLED_SYRINGE | INTRAVENOUS | Status: AC
  Filled 2019-05-17: qty 20

## 2019-05-17 MED ORDER — METAXALONE 800 MG PO TABS
400.0000 mg | ORAL_TABLET | Freq: Three times a day (TID) | ORAL | Status: DC
  Administered 2019-05-18 – 2019-05-19 (×5): 400 mg via ORAL
  Filled 2019-05-17 (×2): qty 0.5
  Filled 2019-05-17: qty 1
  Filled 2019-05-17 (×3): qty 0.5
  Filled 2019-05-17: qty 1
  Filled 2019-05-17 (×2): qty 0.5

## 2019-05-17 MED ORDER — ADULT MULTIVITAMIN W/MINERALS CH
1.0000 | ORAL_TABLET | Freq: Every day | ORAL | Status: DC
  Administered 2019-05-17 – 2019-05-19 (×3): 1 via ORAL
  Filled 2019-05-17 (×3): qty 1

## 2019-05-17 MED ORDER — SUGAMMADEX SODIUM 200 MG/2ML IV SOLN
INTRAVENOUS | Status: DC | PRN
  Administered 2019-05-17: 160 mg via INTRAVENOUS

## 2019-05-17 MED ORDER — MORPHINE SULFATE (PF) 2 MG/ML IV SOLN: 0.5000 mg | INTRAVENOUS | Status: DC | PRN

## 2019-05-17 SURGICAL SUPPLY — 94 items
BANDAGE ESMARK 6X9 LF (GAUZE/BANDAGES/DRESSINGS) ×3 IMPLANT
BIT DRILL 2.5X2.75 QC CALB (BIT) ×2 IMPLANT
BIT DRILL 3.8X6 NS (BIT) ×2 IMPLANT
BIT DRILL 4.4 NS (BIT) ×2 IMPLANT
BLADE AVERAGE 25MMX9MM (BLADE) ×1
BLADE AVERAGE 25X9 (BLADE) ×1 IMPLANT
BLADE SURG 10 STRL SS (BLADE) ×5 IMPLANT
BNDG COHESIVE 6X5 TAN STRL LF (GAUZE/BANDAGES/DRESSINGS) ×5 IMPLANT
BNDG ELASTIC 4X5.8 VLCR STR LF (GAUZE/BANDAGES/DRESSINGS) ×5 IMPLANT
BNDG ELASTIC 6X5.8 VLCR STR LF (GAUZE/BANDAGES/DRESSINGS) ×5 IMPLANT
BNDG ESMARK 6X9 LF (GAUZE/BANDAGES/DRESSINGS)
BNDG GAUZE ELAST 4 BULKY (GAUZE/BANDAGES/DRESSINGS) ×6 IMPLANT
BRUSH SCRUB EZ PLAIN DRY (MISCELLANEOUS) ×10 IMPLANT
CLEANER TIP ELECTROSURG 2X2 (MISCELLANEOUS) ×2 IMPLANT
CLOSURE WOUND 1/2 X4 (GAUZE/BANDAGES/DRESSINGS)
COVER SURGICAL LIGHT HANDLE (MISCELLANEOUS) ×8 IMPLANT
COVER WAND RF STERILE (DRAPES) ×5 IMPLANT
CUFF TOURN SGL QUICK 18X4 (TOURNIQUET CUFF) IMPLANT
CUFF TOURN SGL QUICK 24 (TOURNIQUET CUFF)
CUFF TOURN SGL QUICK 34 (TOURNIQUET CUFF)
CUFF TRNQT CYL 24X4X16.5-23 (TOURNIQUET CUFF) IMPLANT
CUFF TRNQT CYL 34X4.125X (TOURNIQUET CUFF) IMPLANT
DRAPE C-ARM 42X72 X-RAY (DRAPES) ×5 IMPLANT
DRAPE C-ARMOR (DRAPES) ×5 IMPLANT
DRAPE HALF SHEET 40X57 (DRAPES) IMPLANT
DRAPE INCISE IOBAN 66X45 STRL (DRAPES) ×8 IMPLANT
DRAPE SURG 17X23 STRL (DRAPES) ×5 IMPLANT
DRAPE U-SHAPE 47X51 STRL (DRAPES) ×5 IMPLANT
DRSG ADAPTIC 3X8 NADH LF (GAUZE/BANDAGES/DRESSINGS) ×3 IMPLANT
DRSG MEPILEX BORDER 4X8 (GAUZE/BANDAGES/DRESSINGS) ×2 IMPLANT
DRSG MEPITEL 4X7.2 (GAUZE/BANDAGES/DRESSINGS) ×7 IMPLANT
DRSG PAD ABDOMINAL 8X10 ST (GAUZE/BANDAGES/DRESSINGS) ×6 IMPLANT
ELECT REM PT RETURN 9FT ADLT (ELECTROSURGICAL) ×5
ELECTRODE REM PT RTRN 9FT ADLT (ELECTROSURGICAL) ×3 IMPLANT
GAUZE SPONGE 4X4 12PLY STRL (GAUZE/BANDAGES/DRESSINGS) ×3 IMPLANT
GAUZE SPONGE 4X4 12PLY STRL LF (GAUZE/BANDAGES/DRESSINGS) ×6 IMPLANT
GLOVE BIO SURGEON STRL SZ7.5 (GLOVE) ×5 IMPLANT
GLOVE BIO SURGEON STRL SZ8 (GLOVE) ×5 IMPLANT
GLOVE BIO SURGEON STRL SZ8.5 (GLOVE) ×5 IMPLANT
GLOVE BIOGEL PI IND STRL 7.5 (GLOVE) ×3 IMPLANT
GLOVE BIOGEL PI IND STRL 8 (GLOVE) ×3 IMPLANT
GLOVE BIOGEL PI INDICATOR 7.5 (GLOVE) ×2
GLOVE BIOGEL PI INDICATOR 8 (GLOVE) ×2
GOWN STRL REUS W/ TWL LRG LVL3 (GOWN DISPOSABLE) ×6 IMPLANT
GOWN STRL REUS W/ TWL XL LVL3 (GOWN DISPOSABLE) ×3 IMPLANT
GOWN STRL REUS W/TWL LRG LVL3 (GOWN DISPOSABLE) ×10
GOWN STRL REUS W/TWL XL LVL3 (GOWN DISPOSABLE) ×4
GUIDEWIRE BALL NOSE 80CM (WIRE) ×4 IMPLANT
KIT BASIN OR (CUSTOM PROCEDURE TRAY) ×5 IMPLANT
KIT TURNOVER KIT B (KITS) ×5 IMPLANT
MANIFOLD NEPTUNE II (INSTRUMENTS) ×3 IMPLANT
NAIL TIBIAL 10MMX33CM (Nail) ×2 IMPLANT
NEEDLE 22X1 1/2 (OR ONLY) (NEEDLE) ×2 IMPLANT
NS IRRIG 1000ML POUR BTL (IV SOLUTION) ×5 IMPLANT
PACK ORTHO EXTREMITY (CUSTOM PROCEDURE TRAY) ×5 IMPLANT
PAD ABD 8X10 STRL (GAUZE/BANDAGES/DRESSINGS) ×8 IMPLANT
PAD ARMBOARD 7.5X6 YLW CONV (MISCELLANEOUS) ×10 IMPLANT
PAD CAST 4YDX4 CTTN HI CHSV (CAST SUPPLIES) ×3 IMPLANT
PADDING CAST COTTON 4X4 STRL (CAST SUPPLIES) ×4
PADDING CAST COTTON 6X4 STRL (CAST SUPPLIES) ×13 IMPLANT
SCREW ACECAP 36MM (Screw) ×2 IMPLANT
SCREW ACECAP 40MM (Screw) ×2 IMPLANT
SCREW ACECAP 44MM (Screw) ×2 IMPLANT
SCREW CORT FT 32X3.5XNONLOCK (Screw) IMPLANT
SCREW CORTICAL 3.5MM  32MM (Screw) ×12 IMPLANT
SCREW CORTICAL 3.5MM 26MM (Screw) ×2 IMPLANT
SCREW PROXIMAL DEPUY (Screw) ×5 IMPLANT
SCREW PRXML FT 45X5.5XLCK NS (Screw) IMPLANT
SPONGE LAP 18X18 RF (DISPOSABLE) ×5 IMPLANT
SPONGE LAP 18X18 X RAY DECT (DISPOSABLE) ×4 IMPLANT
STAPLER VISISTAT 35W (STAPLE) ×5 IMPLANT
STOCKINETTE IMPERVIOUS LG (DRAPES) ×5 IMPLANT
STRIP CLOSURE SKIN 1/2X4 (GAUZE/BANDAGES/DRESSINGS) IMPLANT
SUCTION FRAZIER HANDLE 10FR (MISCELLANEOUS)
SUCTION TUBE FRAZIER 10FR DISP (MISCELLANEOUS) IMPLANT
SUT ETHILON 2 0 FS 18 (SUTURE) ×12 IMPLANT
SUT ETHILON 3 0 PS 1 (SUTURE) ×10 IMPLANT
SUT PDS AB 2-0 CT1 27 (SUTURE) ×2 IMPLANT
SUT VIC AB 0 CT1 27 (SUTURE) ×10
SUT VIC AB 0 CT1 27XBRD ANBCTR (SUTURE) ×3 IMPLANT
SUT VIC AB 1 CT1 27 (SUTURE) ×4
SUT VIC AB 1 CT1 27XBRD ANBCTR (SUTURE) IMPLANT
SUT VIC AB 2-0 CT1 27 (SUTURE) ×15
SUT VIC AB 2-0 CT1 TAPERPNT 27 (SUTURE) ×3 IMPLANT
SWAB COLLECTION DEVICE MRSA (MISCELLANEOUS) ×2 IMPLANT
SWAB CULTURE LIQUID MINI MALE (MISCELLANEOUS) ×2 IMPLANT
SYR CONTROL 10ML LL (SYRINGE) ×2 IMPLANT
TOWEL GREEN STERILE (TOWEL DISPOSABLE) ×10 IMPLANT
TOWEL GREEN STERILE FF (TOWEL DISPOSABLE) ×10 IMPLANT
TUBE CONNECTING 12'X1/4 (SUCTIONS) ×1
TUBE CONNECTING 12X1/4 (SUCTIONS) ×4 IMPLANT
UNDERPAD 30X30 (UNDERPADS AND DIAPERS) ×5 IMPLANT
WATER STERILE IRR 1000ML POUR (IV SOLUTION) ×10 IMPLANT
YANKAUER SUCT BULB TIP NO VENT (SUCTIONS) ×7 IMPLANT

## 2019-05-17 NOTE — Transfer of Care (Signed)
Immediate Anesthesia Transfer of Care Note  Patient: Daryl Cook  Procedure(s) Performed: HARDWARE REMOVAL TIBIA (Right Leg Lower) INTRAMEDULLARY (IM) NAIL TIBIAL (Right Leg Upper) REPAIR OF NONUNION WITH ILIAC CREST BONE GRAFT FIBULAR OSTEOTOMY (Right Hip)  Patient Location: PACU  Anesthesia Type:GA combined with regional for post-op pain  Level of Consciousness: drowsy and patient cooperative  Airway & Oxygen Therapy: Patient Spontanous Breathing  Post-op Assessment: Report given to RN and Post -op Vital signs reviewed and stable  Post vital signs: Reviewed and stable  Last Vitals:  Vitals Value Taken Time  BP 136/78 05/17/19 1257  Temp    Pulse 98 05/17/19 1258  Resp 9 05/17/19 1258  SpO2 93 % 05/17/19 1258  Vitals shown include unvalidated device data.  Last Pain:  Vitals:   05/17/19 0706  PainSc: 0-No pain         Complications: No apparent anesthesia complications

## 2019-05-17 NOTE — Anesthesia Procedure Notes (Signed)
Anesthesia Regional Block: Popliteal block   Pre-Anesthetic Checklist: ,, timeout performed, Correct Patient, Correct Site, Correct Laterality, Correct Procedure, Correct Position, site marked, Risks and benefits discussed, pre-op evaluation,  At surgeon's request and post-op pain management  Laterality: Right  Prep: Maximum Sterile Barrier Precautions used, chloraprep       Needles:  Injection technique: Single-shot  Needle Type: Echogenic Stimulator Needle     Needle Length: 9cm  Needle Gauge: 22     Additional Needles:   Procedures:,,,, ultrasound used (permanent image in chart),,,,  Narrative:  Start time: 05/17/2019 7:55 AM End time: 05/17/2019 7:57 AM Injection made incrementally with aspirations every 5 mL.  Performed by: Personally  Anesthesiologist: Brennan Bailey, MD  Additional Notes: Risks, benefits, and alternative discussed. Patient gave consent for procedure. Patient prepped and draped in sterile fashion. Sedation administered, patient remains easily responsive to voice. Relevant anatomy identified with ultrasound guidance. Local anesthetic given in 5cc increments with no signs or symptoms of intravascular injection. No pain or paraesthesias with injection. Patient monitored throughout procedure with signs of LAST or immediate complications. Tolerated well. Ultrasound image placed in chart.  Tawny Asal, MD

## 2019-05-17 NOTE — Anesthesia Procedure Notes (Signed)
Procedure Name: Intubation Date/Time: 05/17/2019 8:33 AM Performed by: Lance Coon, CRNA Pre-anesthesia Checklist: Patient identified, Emergency Drugs available, Suction available, Patient being monitored and Timeout performed Patient Re-evaluated:Patient Re-evaluated prior to induction Oxygen Delivery Method: Circle system utilized Preoxygenation: Pre-oxygenation with 100% oxygen Induction Type: IV induction Ventilation: Mask ventilation without difficulty Laryngoscope Size: Miller and 2 Grade View: Grade I Tube type: Oral Tube size: 7.5 mm Number of attempts: 1 Airway Equipment and Method: Stylet Placement Confirmation: ETT inserted through vocal cords under direct vision,  positive ETCO2 and breath sounds checked- equal and bilateral Secured at: 21 cm Tube secured with: Tape Dental Injury: Teeth and Oropharynx as per pre-operative assessment

## 2019-05-17 NOTE — Anesthesia Procedure Notes (Signed)
Anesthesia Regional Block: Adductor canal block   Pre-Anesthetic Checklist: ,, timeout performed, Correct Patient, Correct Site, Correct Laterality, Correct Procedure, Correct Position, site marked, Risks and benefits discussed, pre-op evaluation,  At surgeon's request and post-op pain management  Laterality: Right  Prep: Maximum Sterile Barrier Precautions used, chloraprep       Needles:  Injection technique: Single-shot  Needle Type: Echogenic Stimulator Needle     Needle Length: 9cm  Needle Gauge: 22     Additional Needles:   Procedures:,,,, ultrasound used (permanent image in chart),,,,  Narrative:  Start time: 05/17/2019 7:52 AM End time: 05/17/2019 7:55 AM Injection made incrementally with aspirations every 5 mL.  Performed by: Personally  Anesthesiologist: Brennan Bailey, MD  Additional Notes: Risks, benefits, and alternative discussed. Patient gave consent for procedure. Patient prepped and draped in sterile fashion. Sedation administered, patient remains easily responsive to voice. Relevant anatomy identified with ultrasound guidance. Local anesthetic given in 5cc increments with no signs or symptoms of intravascular injection. No pain or paraesthesias with injection. Patient monitored throughout procedure with signs of LAST or immediate complications. Tolerated well. Ultrasound image placed in chart.  Tawny Asal, MD

## 2019-05-17 NOTE — Progress Notes (Signed)
Received from PACU. See assessment.

## 2019-05-17 NOTE — Progress Notes (Signed)
Orthopedic Tech Progress Note Patient Details:  Daryl Cook August 13, 1955 [id number]  Ortho Devices Ortho Device/Splint Location: Trapeze bar Ortho Device/Splint Interventions: Application       [name] 05/17/2019, 6:36 PM

## 2019-05-17 NOTE — Anesthesia Postprocedure Evaluation (Signed)
Anesthesia Post Note  Patient: Daryl Cook  Procedure(s) Performed: HARDWARE REMOVAL TIBIA (Right Leg Lower) INTRAMEDULLARY (IM) NAIL TIBIAL (Right Leg Upper) REPAIR OF NONUNION WITH ILIAC CREST BONE GRAFT FIBULAR OSTEOTOMY (Right Hip)     Patient location during evaluation: PACU Anesthesia Type: General Level of consciousness: awake and alert and oriented Pain management: pain level controlled Vital Signs Assessment: post-procedure vital signs reviewed and stable Respiratory status: spontaneous breathing, nonlabored ventilation and respiratory function stable Cardiovascular status: blood pressure returned to baseline Postop Assessment: no apparent nausea or vomiting Anesthetic complications: no                  Brennan Bailey

## 2019-05-18 ENCOUNTER — Encounter (HOSPITAL_COMMUNITY): Payer: Self-pay | Admitting: Orthopedic Surgery

## 2019-05-18 DIAGNOSIS — K219 Gastro-esophageal reflux disease without esophagitis: Secondary | ICD-10-CM | POA: Diagnosis present

## 2019-05-18 DIAGNOSIS — B182 Chronic viral hepatitis C: Secondary | ICD-10-CM | POA: Diagnosis present

## 2019-05-18 LAB — CBC
HCT: 33.2 % — ABNORMAL LOW (ref 39.0–52.0)
Hemoglobin: 11.3 g/dL — ABNORMAL LOW (ref 13.0–17.0)
MCH: 31.2 pg (ref 26.0–34.0)
MCHC: 34 g/dL (ref 30.0–36.0)
MCV: 91.7 fL (ref 80.0–100.0)
Platelets: 194 10*3/uL (ref 150–400)
RBC: 3.62 MIL/uL — ABNORMAL LOW (ref 4.22–5.81)
RDW: 12.7 % (ref 11.5–15.5)
WBC: 9.6 10*3/uL (ref 4.0–10.5)
nRBC: 0 % (ref 0.0–0.2)

## 2019-05-18 MED ORDER — CHLORHEXIDINE GLUCONATE CLOTH 2 % EX PADS
6.0000 | MEDICATED_PAD | Freq: Every day | CUTANEOUS | Status: DC
  Administered 2019-05-18 – 2019-05-19 (×2): 6 via TOPICAL

## 2019-05-18 NOTE — Evaluation (Signed)
Occupational Therapy Evaluation Patient Details Name: Daryl Cook MRN: [id number] DOB: 1955-06-11 Today's Date: 05/18/2019    History of Present Illness  Daryl Cook is an 64 year old male with a nonunion ofrighttibia following deformity correction for malunion.  s/p revision with exchange nailing, fibular osteotomy and ICBG grafting.    Clinical Impression   Pt currently demonstrates ability to complete ADL/IADL and functional mobility with use of crutches at modified independent level. Pt reports functioning at level of functioning prior to recent surgery. Pt demonstrates good safety awareness and safe mobility with use of crutches. Educated pt on importance of mobilizing RLE and keeping it elevated. Patient evaluated by Occupational Therapy with no further acute OT needs identified. All education has been completed and the patient has no further questions. See below for any follow-up Occupational Therapy or equipment needs. OT to sign off. Thank you for referral.      Follow Up Recommendations  No OT follow up;Follow surgeon's recommendation for DC plan and follow-up therapies    Equipment Recommendations    none   Recommendations for Other Services       Precautions / Restrictions Precautions Precautions: Fall Precaution Comments: PWB 50%,Unrestricted ROM R Knee and ankle, per ortho note Restrictions Weight Bearing Restrictions: Yes Other Position/Activity Restrictions: Unrestricted ROM R Knee and ankle      Mobility Bed Mobility               General bed mobility comments: pt sitting in recliner upon arrival  Transfers Overall transfer level: Needs assistance Equipment used: Crutches Transfers: Sit to/from Stand Sit to Stand: Modified independent (Device/Increase time)              Balance Overall balance assessment: Mild deficits observed, not formally tested                                         ADL either performed or  assessed with clinical judgement   ADL Overall ADL's : At baseline                                       General ADL Comments: pt able to complete ADL at modified independent level;functional mobiltiy with crutches     Vision Patient Visual Report: No change from baseline       Perception     Praxis      Pertinent Vitals/Pain Pain Assessment: 0-10 Pain Score: 3  Pain Location: RLE Pain Descriptors / Indicators: Sore;Discomfort Pain Intervention(s): Limited activity within patient's tolerance;Monitored during session     Hand Dominance Right   Extremity/Trunk Assessment Upper Extremity Assessment Upper Extremity Assessment: Overall WFL for tasks assessed   Lower Extremity Assessment Lower Extremity Assessment: RLE deficits/detail;Defer to PT evaluation RLE Deficits / Details: s/p revision of malunion   Cervical / Trunk Assessment Cervical / Trunk Assessment: Normal   Communication Communication Communication: No difficulties   Cognition Arousal/Alertness: Awake/alert Behavior During Therapy: WFL for tasks assessed/performed Overall Cognitive Status: Within Functional Limits for tasks assessed                                     General Comments  vss: pt on RA throughout session SpO2 94%-98%;educated pt on pursed lip  breathing through challenging activities    Exercises     Shoulder Instructions      Home Living Family/patient expects to be discharged to:: Private residence Living Arrangements: Spouse/significant other Available Help at Discharge: Other (Comment);Available 24 hours/day(girlfriend) Type of Home: House Home Access: Ramped entrance;Level entry     Home Layout: One level     Bathroom Shower/Tub: Tub/shower unit;Walk-in shower   Bathroom Toilet: Standard     Home Equipment: Grab bars - tub/shower;Adaptive equipment   Additional Comments: has BSC, tub seat      Prior Functioning/Environment Level of  Independence: Independent                 OT Problem List: Impaired balance (sitting and/or standing)      OT Treatment/Interventions:      OT Goals(Current goals can be found in the care plan section) Acute Rehab OT Goals Patient Stated Goal: to go home today or tomorrow OT Goal Formulation: With patient Time For Goal Achievement: 05/25/19 Potential to Achieve Goals: Good  OT Frequency:     Barriers to D/C:            Co-evaluation              AM-PAC OT "6 Clicks" Daily Activity     Outcome Measure Help from another person eating meals?: None Help from another person taking care of personal grooming?: None Help from another person toileting, which includes using toliet, bedpan, or urinal?: None Help from another person bathing (including washing, rinsing, drying)?: None Help from another person to put on and taking off regular upper body clothing?: None Help from another person to put on and taking off regular lower body clothing?: None 6 Click Score: 24   End of Session Equipment Utilized During Treatment: (crutches) Nurse Communication: Mobility status  Activity Tolerance: Patient tolerated treatment well Patient left: in chair;with call bell/phone within reach;with family/visitor present  OT Visit Diagnosis: Other abnormalities of gait and mobility (R26.89)                Time: 8657-8469 OT Time Calculation (min): 10 min Charges:  OT General Charges $OT Visit: 1 Visit OT Evaluation $OT Eval Low Complexity: 1 Low  Duaa Stelzner OTR/L Acute Rehabilitation Services Office: [phone]   Rebeca Alert 05/18/2019, 1:04 PM

## 2019-05-18 NOTE — Progress Notes (Addendum)
Orthopaedic Trauma Service Progress Note  Patient ID: Daryl Cook MRN: [id number] DOB/AGE: [birth year] 64 y.o.  Subjective:  Doing ok this am Pain at R ICBG donor site moderate Block still working in R leg but starting to have some tingling   no other complaints   Intra-op gram stain negative. Culture with NGTD   Review of Systems  Constitutional: Negative for chills and fever.  Eyes: Negative for blurred vision and double vision.  Cardiovascular: Negative for chest pain and palpitations.  Gastrointestinal: Negative for abdominal pain, nausea and vomiting.  Musculoskeletal:       R leg pain   Neurological: Negative for dizziness and headaches.    Objective:   VITALS:   Vitals:   05/17/19 1949 05/18/19 0018 05/18/19 0515 05/18/19 0842  BP: (!) 163/84 127/67 136/78 (!) 146/78  Pulse: 98 (!) 103 96 76  Resp: 18 18 18 18   Temp: (!) 97.5 F (36.4 C) 97.7 F (36.5 C) 97.8 F (36.6 C) 97.8 F (36.6 C)  TempSrc: Oral Oral Oral Oral  SpO2: 99% 94% 98% 100%  Weight:      Height:        Estimated body mass index is 25.85 kg/m as calculated from the following:   Height as of this encounter: 5\' 8"  (1.727 m).   Weight as of this encounter: 77.1 kg.   Intake/Output      03/16 0701 - 03/17 0700 03/17 0701 - 03/18 0700   P.O. 440 240   I.V. (mL/kg) 2350 (30.5)    Total Intake(mL/kg) 2790 (36.2) 240 (3.1)   Urine (mL/kg/hr) 2200 (1.2) 600 (2.6)   Blood 200    Total Output 2400 600   Net +390 -360          LABS  Results for orders placed or performed during the hospital encounter of 05/17/19 (from the past 24 hour(s))  CBC     Status: None   Collection Time: 05/17/19  6:03 PM  Result Value Ref Range   WBC 9.6 4.0 - 10.5 K/uL   RBC 4.26 4.22 - 5.81 MIL/uL   Hemoglobin 13.1 13.0 - 17.0 g/dL   HCT 39.9 39.0 - 52.0 %   MCV 93.7 80.0 - 100.0 fL   MCH 30.8 26.0 - 34.0 pg   MCHC 32.8 30.0 -  36.0 g/dL   RDW 13.0 11.5 - 15.5 %   Platelets 216 150 - 400 K/uL   nRBC 0.0 0.0 - 0.2 %  Creatinine, serum     Status: None   Collection Time: 05/17/19  6:03 PM  Result Value Ref Range   Creatinine, Ser 0.97 0.61 - 1.24 mg/dL   GFR calc non Af Amer >60 >60 mL/min   GFR calc Af Amer >60 >60 mL/min  CBC     Status: Abnormal   Collection Time: 05/18/19  2:13 AM  Result Value Ref Range   WBC 9.6 4.0 - 10.5 K/uL   RBC 3.62 (L) 4.22 - 5.81 MIL/uL   Hemoglobin 11.3 (L) 13.0 - 17.0 g/dL   HCT 33.2 (L) 39.0 - 52.0 %   MCV 91.7 80.0 - 100.0 fL   MCH 31.2 26.0 - 34.0 pg   MCHC 34.0 30.0 - 36.0 g/dL   RDW 12.7 11.5 - 15.5 %   Platelets 194 150 -  400 K/uL   nRBC 0.0 0.0 - 0.2 %     PHYSICAL EXAM:    Gen: resting comfortably in bed, NAD, appears well. 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 Active Problems:   Closed fracture of tibia and fibula with malunion, right   Chronic hepatitis C (HCC)   GERD (gastroesophageal reflux disease)   Anti-infectives (From admission, onward)   Start     Dose/Rate Route Frequency Ordered Stop   05/17/19 1615  ceFAZolin (ANCEF) IVPB 1 g/50 mL premix     1 g 100 mL/hr over 30 Minutes Intravenous Every 6 hours 05/17/19 1605 05/18/19 1014   05/17/19 0701  ceFAZolin (ANCEF) 2-4 GM/100ML-% IVPB    Note to Pharmacy: Barbie Haggis   : cabinet override      05/17/19 0701 05/17/19 0840   05/17/19 0645  ceFAZolin (ANCEF) IVPB 2g/100 mL premix     2 g 200 mL/hr over 30  Minutes Intravenous On call to O.R. 05/17/19 [id number] 05/17/19 0840    .  POD/HD#: 75  64 year old male with a nonunion of right tibia following deformity correction for malunion   -Nonunion right tibia deformity correction s/p revision with exchange nailing, fibular osteotomy and ICBG grafting  Partial WB R leg--> 50%  Unrestricted ROM R Knee and ankle  PT/OT evals  Ice and elevate  Dressing changes tomorrow   Bone stim has been ordered               - Pain management:            continue with current regimen  Instructed pt to start asking for PRN meds as he is starting to feel the block wearing off  Hoping to avoid refractory pain    Scheduled tylenol and ultram    Scheduled skelaxin     Scheduled toradol x 2 days (low dose)   Norco PRN    Morphine IV prn for severe breakthrough pain after all po meds given    Ice   Elevate   Mobilize    - ABL anemia/Hemodynamics             Stable preop labs             Monitor   - Medical issues              Chronic hepatitis C   - DVT/PE prophylaxis:             Lovenox postop x 21 days   - ID:              Perioperative antibiotics   - Metabolic Bone Disease:             Vitamin D is low normal, continue supplement   - Activity:             As above   - FEN/GI prophylaxis/FoleyTatian/Lines:  Reg diet  IVF    - Impediments to fracture healing:             Nonunion   - Dispo:             therapy evals  Pain control  Plan for dc home tomorrow    Jari Pigg, PA-C [phone] (C) 05/18/2019, 9:58 AM  [facility] [place] [facility] [place] [address] [phone] Domingo Sep (F)

## 2019-05-18 NOTE — Evaluation (Signed)
Physical Therapy Evaluation Patient Details Name: Daryl Cook MRN: [id number] DOB: 03-02-1956 Today's Date: 05/18/2019   History of Present Illness   Daryl Cook is an 64 year old male with a nonunion ofrighttibia following deformity correction for malunion.  s/p revision with exchange nailing, fibular osteotomy and ICBG grafting.   Clinical Impression   Patient evaluated by Physical Therapy with no further acute PT needs identified. All education has been completed and the patient has no further questions. Has been managing on crutches for quite a while, and has no difficulty with mobility with crutches;  See below for any follow-up Physical Therapy or equipment needs. PT is signing off. Thank you for this referral.     Follow Up Recommendations Outpatient PT;Supervision - Intermittent(The potential need for Outpatient PT can be addressed at Ortho follow-up appointments. )    Equipment Recommendations  None recommended by PT    Recommendations for Other Services       Precautions / Restrictions Precautions Precautions: Fall Precaution Comments: PWB 50%,Unrestricted ROM R Knee and ankle, per ortho note Restrictions Weight Bearing Restrictions: Yes Other Position/Activity Restrictions: Unrestricted ROM R Knee and ankle      Mobility  Bed Mobility Overal bed mobility: Independent             General bed mobility comments: no difficulty  Transfers Overall transfer level: Needs assistance Equipment used: Crutches Transfers: Sit to/from Stand Sit to Stand: Modified independent (Device/Increase time)         General transfer comment: Managing crutches well  Ambulation/Gait Ambulation/Gait assistance: Supervision;Modified independent (Device/Increase time) Gait Distance (Feet): 200 Feet Assistive device: Crutches Gait Pattern/deviations: Step-through pattern     General Gait Details: Supervision initially with cues to self-monitor for activity tolerance;  progressed to modified independent   Stairs            Wheelchair Mobility    Modified Rankin (Stroke Patients Only)       Balance Overall balance assessment: Mild deficits observed, not formally tested                                           Pertinent Vitals/Pain Pain Assessment: 0-10 Pain Score: 6  Pain Location: RLE Pain Descriptors / Indicators: Sore;Discomfort Pain Intervention(s): Monitored during session    Home Living Family/patient expects to be discharged to:: Private residence Living Arrangements: Spouse/significant other Available Help at Discharge: Other (Comment);Available 24 hours/day(girlfriend) Type of Home: House Home Access: Ramped entrance;Level entry     Home Layout: One level Home Equipment: Grab bars - tub/shower;Adaptive equipment Additional Comments: has BSC, tub seat    Prior Function Level of Independence: Independent               Hand Dominance   Dominant Hand: Right    Extremity/Trunk Assessment   Upper Extremity Assessment Upper Extremity Assessment: Defer to OT evaluation    Lower Extremity Assessment Lower Extremity Assessment: RLE deficits/detail RLE Deficits / Details: s/p revision of malunion; Grossly decr strength and ROM of knee, limited by postop pain     Cervical / Trunk Assessment Cervical / Trunk Assessment: Normal  Communication   Communication: No difficulties  Cognition Arousal/Alertness: Awake/alert Behavior During Therapy: WFL for tasks assessed/performed Overall Cognitive Status: Within Functional Limits for tasks assessed  General Comments General comments (skin integrity, edema, etc.): VSS on Room Air    Exercises     Assessment/Plan    PT Assessment All further PT needs can be met in the next venue of care  PT Problem List Decreased strength;Decreased range of motion       PT Treatment Interventions       PT Goals (Current goals can be found in the Care Plan section)  Acute Rehab PT Goals Patient Stated Goal: to go home today or tomorrow PT Goal Formulation: All assessment and education complete, DC therapy    Frequency     Barriers to discharge        Co-evaluation               AM-PAC PT "6 Clicks" Mobility  Outcome Measure Help needed turning from your back to your side while in a flat bed without using bedrails?: None Help needed moving from lying on your back to sitting on the side of a flat bed without using bedrails?: None Help needed moving to and from a bed to a chair (including a wheelchair)?: None Help needed standing up from a chair using your arms (e.g., wheelchair or bedside chair)?: None Help needed to walk in hospital room?: None Help needed climbing 3-5 steps with a railing? : None 6 Click Score: 24    End of Session Equipment Utilized During Treatment: Gait belt Activity Tolerance: Patient tolerated treatment well Patient left: in chair;with family/visitor present Nurse Communication: Mobility status PT Visit Diagnosis: Other abnormalities of gait and mobility (R26.89);Pain;Muscle weakness (generalized) (M62.81) Pain - Right/Left: Right Pain - part of body: Leg    Time: 1201-1220 PT Time Calculation (min) (ACUTE ONLY): 19 min   Charges:   PT Evaluation $PT Eval Low Complexity: [place], [place] Pager [phone] Office [place] 05/18/2019, 1:49 PM

## 2019-05-19 MED ORDER — METAXALONE 400 MG PO TABS: 400.0000 mg | ORAL_TABLET | Freq: Three times a day (TID) | ORAL | 1 refills | Status: DC | PRN

## 2019-05-19 MED ORDER — HYDROCODONE-ACETAMINOPHEN 7.5-325 MG PO TABS: 1.0000 | ORAL_TABLET | Freq: Four times a day (QID) | ORAL | 0 refills | Status: DC | PRN

## 2019-05-19 MED ORDER — VITAMIN D 125 MCG (5000 UT) PO CAPS: 1.0000 | ORAL_CAPSULE | Freq: Every day | ORAL | 2 refills | Status: DC

## 2019-05-19 MED ORDER — ACETAMINOPHEN 500 MG PO TABS: 500.0000 mg | ORAL_TABLET | Freq: Three times a day (TID) | ORAL | 0 refills | Status: DC

## 2019-05-19 MED ORDER — DOCUSATE SODIUM 100 MG PO CAPS: 100.0000 mg | ORAL_CAPSULE | Freq: Two times a day (BID) | ORAL | 0 refills | Status: DC

## 2019-05-19 MED ORDER — ASCORBIC ACID 500 MG PO TABS: 500.0000 mg | ORAL_TABLET | Freq: Every day | ORAL | 1 refills | Status: DC

## 2019-05-19 MED ORDER — ENOXAPARIN SODIUM 40 MG/0.4ML ~~LOC~~ SOLN: 40.0000 mg | SUBCUTANEOUS | 0 refills | Status: DC

## 2019-05-19 MED ORDER — ONDANSETRON 4 MG PO TBDP: 4.0000 mg | ORAL_TABLET | Freq: Three times a day (TID) | ORAL | 0 refills | Status: DC | PRN

## 2019-05-19 NOTE — Discharge Instructions (Signed)
Orthopaedic Trauma Service Discharge Instructions   General Discharge Instructions   WEIGHT BEARING STATUS: Partial weightbearing right leg, 50 % of bodyweight, you will need to use a walker or crutches   RANGE OF MOTION/ACTIVITY: Unrestricted range of motion right knee and ankle. activity as tolerated while maintaining weightbearing restrictions  Bone health: Your vitamin D is the low end of normal would continue taking vitamin D supplementation indefinitely with yearly vitamin D level checks  Wound Care: daily wound care starting on 05/22/2019.  Please see below.  Okay to leave wounds open to air if they are dry.  Clean with soap and water only  Discharge Wound Care Instructions  Do NOT apply any ointments, solutions or lotions to pin sites or surgical wounds.  These prevent needed drainage and even though solutions like hydrogen peroxide kill bacteria, they also damage cells lining the pin sites that help fight infection.  Applying lotions or ointments can keep the wounds moist and can cause them to breakdown and open up as well. This can increase the risk for infection. When in doubt call the office.  Surgical incisions should be dressed daily.  If any drainage is noted, use one layer of adaptic, then gauze, Kerlix, and an ace wrap.  Once the incision is completely dry and without drainage, it may be left open to air out.  Showering may begin 36-48 hours later.  Cleaning gently with soap and water.  DVT/PE prophylaxis: Lovenox 40 mg subcutaneous injection daily x 21 days   Diet: as you were eating previously.  Can use over the counter stool softeners and bowel preparations, such as Miralax, to help with bowel movements.  Narcotics can be constipating.  Be sure to drink plenty of fluids  PAIN MEDICATION USE AND EXPECTATIONS  You have likely been given narcotic medications to help control your pain.  After a traumatic event that results in an fracture (broken bone) with or without  surgery, it is ok to use narcotic pain medications to help control one's pain.  We understand that everyone responds to pain differently and each individual patient will be evaluated on a regular basis for the continued need for narcotic medications. Ideally, narcotic medication use should last no more than 6-8 weeks (coinciding with fracture healing).   As a patient it is your responsibility as well to monitor narcotic medication use and report the amount and frequency you use these medications when you come to your office visit.   We would also advise that if you are using narcotic medications, you should take a dose prior to therapy to maximize you participation.  IF YOU ARE ON NARCOTIC MEDICATIONS IT IS NOT PERMISSIBLE TO OPERATE A MOTOR VEHICLE (MOTORCYCLE/CAR/TRUCK/MOPED) OR HEAVY MACHINERY DO NOT MIX NARCOTICS WITH OTHER CNS (CENTRAL NERVOUS SYSTEM) DEPRESSANTS SUCH AS ALCOHOL   STOP SMOKING OR USING NICOTINE PRODUCTS!!!!  As discussed nicotine severely impairs your body's ability to heal surgical and traumatic wounds but also impairs bone healing.  Wounds and bone heal by forming microscopic blood vessels (angiogenesis) and nicotine is a vasoconstrictor (essentially, shrinks blood vessels).  Therefore, if vasoconstriction occurs to these microscopic blood vessels they essentially disappear and are unable to deliver necessary nutrients to the healing tissue.  This is one modifiable factor that you can do to dramatically increase your chances of healing your injury.    (This means no smoking, no nicotine gum, patches, etc)  DO NOT USE NONSTEROIDAL ANTI-INFLAMMATORY DRUGS (NSAID'S)  Using products such as Advil (ibuprofen),  Aleve (naproxen), Motrin (ibuprofen) for additional pain control during fracture healing can delay and/or prevent the healing response.  If you would like to take over the counter (OTC) medication, Tylenol (acetaminophen) is ok.  However, some narcotic medications that are given  for pain control contain acetaminophen as well. Therefore, you should not exceed more than 4000 mg of tylenol in a day if you do not have liver disease.  Also note that there are may OTC medicines, such as cold medicines and allergy medicines that my contain tylenol as well.  If you have any questions about medications and/or interactions please ask your doctor/PA or your pharmacist.      ICE AND ELEVATE INJURED/OPERATIVE EXTREMITY  Using ice and elevating the injured extremity above your heart can help with swelling and pain control.  Icing in a pulsatile fashion, such as 20 minutes on and 20 minutes off, can be followed.    Do not place ice directly on skin. Make sure there is a barrier between to skin and the ice pack.    Using frozen items such as frozen peas works well as the conform nicely to the are that needs to be iced.  USE AN ACE WRAP OR TED HOSE FOR SWELLING CONTROL  In addition to icing and elevation, Ace wraps or TED hose are used to help limit and resolve swelling.  It is recommended to use Ace wraps or TED hose until you are informed to stop.    When using Ace Wraps start the wrapping distally (farthest away from the body) and wrap proximally (closer to the body)   Example: If you had surgery on your leg or thing and you do not have a splint on, start the ace wrap at the toes and work your way up to the thigh        If you had surgery on your upper extremity and do not have a splint on, start the ace wrap at your fingers and work your way up to the upper arm  IF YOU ARE IN A SPLINT OR CAST DO NOT [place]   If your splint gets wet for any reason please contact the office immediately. You may shower in your splint or cast as long as you keep it dry.  This can be done by wrapping in a cast cover or garbage back (or similar)  Do Not stick any thing down your splint or cast such as pencils, money, or hangers to try and scratch yourself with.  If you feel itchy take  benadryl as prescribed on the bottle for itching  IF YOU ARE IN A CAM BOOT (BLACK BOOT)  You may remove boot periodically. Perform daily dressing changes as noted below.  Wash the liner of the boot regularly and wear a sock when wearing the boot. It is recommended that you sleep in the boot until told otherwise    Call office for the following:  Temperature greater than 101F  Persistent nausea and vomiting  Severe uncontrolled pain  Redness, tenderness, or signs of infection (pain, swelling, redness, odor or green/yellow discharge around the site)  Difficulty breathing, headache or visual disturbances  Hives  Persistent dizziness or light-headedness  Extreme fatigue  Any other questions or concerns you may have after discharge  In an emergency, call 911 or go to an Emergency Department at a nearby hospital    [place]: [place]  INFORMATION: [email or web address]

## 2019-05-19 NOTE — TOC Transition Note (Signed)
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  Pt will transition to home with wife. Pt instructed when he schedules f/u appointment he is to let office no where to make referral for outpatient PT.  No DME needed. Wife to provide transportation to home.  Final next level of care: Home/Self Care(outpatient PT) Barriers to Discharge: No Barriers Identified   Patient Goals and CMS Choice        Discharge Placement   Discharge Plan and Services          Social Determinants of Health (SDOH) Interventions     Readmission Risk Interventions No flowsheet data found.

## 2019-05-19 NOTE — Progress Notes (Signed)
Orthopaedic Trauma Service Progress Note  Patient ID: Daryl Cook MRN: [id number] DOB/AGE: 04/25/1955 64 y.o.  Subjective:  Doing well Wants to go home Pain is controlled Tolerating diet  Has done really well with therapy  ROS As above  Objective:   VITALS:   Vitals:   05/18/19 1403 05/18/19 1947 05/19/19 0452 05/19/19 0700  BP: (!) 147/84 (!) 148/80 138/82 (!) 149/86  Pulse: 89 91 83 86  Resp: 18 18 18 17   Temp: 97.9 F (36.6 C) 98.1 F (36.7 C) 98 F (36.7 C) 98 F (36.7 C)  TempSrc: Oral Oral Oral Oral  SpO2: 98% 98% 97% 97%  Weight:      Height:        Estimated body mass index is 25.85 kg/m as calculated from the following:   Height as of this encounter: 5\' 8"  (1.727 m).   Weight as of this encounter: 77.1 kg.   Intake/Output      03/17 0701 - 03/18 0700 03/18 0701 - 03/19 0700   P.O. 480 360   I.V. (mL/kg) 1172.6 (15.2)    IV Piggyback 100    Total Intake(mL/kg) 1752.6 (22.7) 360 (4.7)   Urine (mL/kg/hr) 1750 (0.9) 500 (1.5)   Stool  0   Blood     Total Output 1750 500   Net +2.6 -140        Stool Occurrence  1 x     LABS  No results found for this or any previous visit (from the past 24 hour(s)).   PHYSICAL EXAM:   Gen: resting comfortably in bed, NAD, appears well Lungs: unlabored Cardiac: regular  Abd: NTND Ext:       Right Lower Extremity              Dressing R flank with scant strike-through, stable    Incision looks great   Moderate ecchymosis present             Dressing R leg c/d/i   Dressing changed   All wounds are clean and stable   Scant bloody drainage at incision over nonunion site             Ext warm              + DP pulse             motor and sensory functions at baseline              Swelling stable             No pain out of proportion with passive stretch              No DCT              Compartments are soft    Assessment/Plan: 2 Days Post-Op   Principal Problem:   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 Active Problems:   Closed fracture of tibia and fibula with malunion, right   Chronic hepatitis C (HCC)   GERD (gastroesophageal reflux disease)   Anti-infectives (From admission, onward)   Start     Dose/Rate Route Frequency Ordered Stop   05/17/19 1615  ceFAZolin (ANCEF) IVPB 1 g/50 mL premix     1 g 100 mL/hr over 30 Minutes Intravenous Every 6 hours  05/17/19 1605 05/18/19 1357   05/17/19 0701  ceFAZolin (ANCEF) 2-4 GM/100ML-% IVPB    Note to Pharmacy: Barbie Haggis   : cabinet override      05/17/19 0701 05/17/19 0840   05/17/19 0645  ceFAZolin (ANCEF) IVPB 2g/100 mL premix     2 g 200 mL/hr over 30 Minutes Intravenous On call to O.R. 05/17/19 [id number] 05/17/19 0840    .  POD/HD#: 32  64 year old male with a nonunion of right tibia following deformity correction for malunion   -Nonunion right tibia deformity correction s/p revision with exchange nailing, fibular osteotomy and ICBG grafting             Partial WB R leg--> 50%             Unrestricted ROM R Knee and ankle             PT/OT             Ice and elevate             Dressing changed today   Change again on 05/21/2019   Can leave open to air once dry    Clean with soap and water only               Bone stim has been ordered               - Pain management:            norco and skelaxin at [facility]              Mobilize     - ABL anemia/Hemodynamics             Stable   - Medical issues              Chronic hepatitis C   - DVT/PE prophylaxis:             Lovenox postop x 21 days   - ID:              Perioperative antibiotics completed    - Metabolic Bone Disease:             Vitamin D is low normal, continue supplement   - Activity:             As above   - FEN/GI prophylaxis/FoleyTatian/Lines:                      Reg diet    - Impediments to fracture  healing:             Nonunion   - Dispo:             dc home today   Follow up in 10-14 days with [place], PA-C [phone] (C) 05/19/2019, 11:17 AM  [facility] [place] [address] [phone] (O) [phone] (F)
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
## 2019-05-19 NOTE — Plan of Care (Signed)
  Problem: Education: Goal: Knowledge of General Education information will improve Description Including pain rating scale, medication(s)/side effects and non-pharmacologic comfort measures Outcome: Progressing   Problem: Health Behavior/Discharge Planning: Goal: Ability to manage health-related needs will improve Outcome: Progressing   

## 2019-05-19 NOTE — Progress Notes (Signed)
Pt reviewed AVS all questions answered to satisfaction. Pt IV removed and bandage on hip changed. Belongings gathered and pt dressed, ready to wheel downstairs to be taken home by girlfriend.

## 2019-05-22 LAB — AEROBIC/ANAEROBIC CULTURE W GRAM STAIN (SURGICAL/DEEP WOUND): Culture: NO GROWTH

## 2019-05-24 LAB — NICOTINE/COTININE METABOLITES
Cotinine: 19.5 ng/mL
Nicotine: <1 ng/mL

## 2019-06-13 NOTE — Op Note (Signed)
NAME: Daryl Cook, Daryl Cook MEDICAL RECORD [id number] ACCOUNT [id number] DATE OF BIRTH:[birth year] FACILITY: [facility] LOCATION: [facility] PHYSICIAN:Erandy Mceachern H. Hamed Debella, MD  OPERATIVE REPORT  DATE OF PROCEDURE:  05/17/2019  PREOPERATIVE DIAGNOSES:   1.  Right tibia atrophic nonunion. 2.  Loose hardware, symptomatic right tibia.  POSTOPERATIVE DIAGNOSES:  1.  Right tibia atrophic nonunion. 2.  Loose hardware, symptomatic right tibia.  PROCEDURES: 1.  Repair of right tibia nonunion with iliac crest autografting. 2.  Intramedullary nailing of the right tibia. 3.  Removal of deep implant, right tibia. 4.  Osteotomy of right fibula.  SURGEON:  Myrene Galas, MD  ASSISTANT:  Montez Morita, PA-C  ANESTHESIA:  General.  COMPLICATIONS:  None.    INPUTS AND OUTPUTS:  Please refer to the anesthetic record for a complete account.  DISPOSITION:  To PACU.  CONDITION:  Stable.  INDICATIONS FOR PROCEDURE:  The patient is a very pleasant 64 year old male who underwent osteotomy for severe and longstanding varus deformity of his tibia and fibula.  We were able to achieve an excellent reduction and alignment at that time, but  unfortunately the patient did not go on to unite despite prolonged conservative measures.  CT scan confirmed this was atrophic.  Patient continued to have progressive pain at the locking bolt sites, as well.  I discussed with him the risks and benefits  as well as the alternatives of different treatment methods, which included isolated repair of the nonunion just using an iliac crest grafting and open debridement, just performing an exchange nailing, performing Soulier in combination with the above, and  adding an osteotomy of the fibula for some compression, and then lastly converting to a dynamic nail by removal of the more symptomatic proximal screws and placement of a dynamic one.  In the end the patient desires maximizing the potential for healing,  wished to proceed with all  portions discussed.  I did review this with my trauma colleagues who likewise supported this approach.  He understood the risks to include the need for further surgery, DVT, PE, loss of motion, hardware breakage, nerve injury,  vessel injury, infection, and multiple others.  BRIEF SUMMARY OF PROCEDURE:  The patient was given preoperative antibiotics, taken to the operating room where general anesthesia was induced.  We did prep and drape his right tibia as well as the right iliac crest.  I began with making incisions over  all the locking bolts and at the knee and withdrawing the blocking screws.  I then opened the fracture site through a longitudinal incision used previously.  I was able to carry down and with the help of a 15 blade and C-arm, specifically locate the area  of nonunion.  This was cleaned thoroughly again with both scalpel and curettes.  It was irrigated thoroughly.  I then sent this material for culture and Gram stain.  Gram stain would be negative for bacteria and consequently we proceeded with the  additional portions of the procedure.  I removed the intramedullary nail and then turned my attention to the iliac crest area.  Here a 4 cm incision was made carrying dissection down to the iliac crest, which was in the a muscular plane using an  osteotome to create a trap door and then harvesting proximally 30 mL of healthy cancellous high quality graft.  This was carefully protected on the back table for subsequent implantation.  I then turned my attention to the tibia while my assistant was  able to pack after some Gelfoam the  iliac crest wound in standard layered fashion.  I brought in the C-arm and sequentially reamed 2 additional mm from the prior nail.  In order to hold this in the correct alignment I did place 2 blocking screws, as  well, 1 on the far fragment and 1 on the near fragment to prevent any varus whatsoever.  I then re-reamed and made certain that the nail could pass.  The  nail was carefully advanced watching on AP and lateral views.  Once this was across, I then turned  attention to the fibula.  Here, a separate incision was made in the mid shaft area.  Dissection carried carefully down to the bone, which was isolated from soft tissue by a baby Hohmann on either side.  Small saw was used to excise 1.5 cm of fibula.   There were no complications with this.  It was irrigated thoroughly and then closed in standard layered fashion.  I then attempted to compress at the fracture site as much as possible manually.  Placed 2 screws in the old incision an area distally, but  proximally, created a new incision and placed a dynamic screw only at the proximal aspect of the slotted hole.  All hardware was checked for position and length and was found to be appropriate.  Lastly, the autograft was then packed into the nonunion  site completely bridging the fracture gap.  A layered closure was performed and then a gentle compressive sterile dressing applied.  Montez Morita, PA-C, was present and assisting throughout.  This was absolutely necessary for this extremely complex and  multistep surgery.  PROGNOSIS:  The patient will be touchdown weightbearing on the right lower extremity and will return to the office after discharge 2 weeks for removal of sutures and new x-rays.  He remains at elevated risk of persistent nonunion given the longstanding  deformity, but we are hopeful he will go on to unite and subsequently be able to obtain total knee replacement.  He will be on pharmacologic DVT prophylaxis and continue the bone stimulator.  CN/NUANCE  D:06/12/2019 T:06/13/2019 JOB:[id number]

## 2019-06-15 ENCOUNTER — Telehealth: Payer: Self-pay

## 2019-06-15 NOTE — Telephone Encounter (Signed)
Spoke to pt to see how he was doing after recent surgery on his leg. He said he is doing fine and appreciated the call.

## 2019-07-25 ENCOUNTER — Other Ambulatory Visit: Payer: Self-pay | Admitting: Internal Medicine

## 2019-07-25 NOTE — Telephone Encounter (Signed)
Refill request Clonazepam Last refill 05/07/19 #30 Last office visit 12/21/18

## 2019-09-09 ENCOUNTER — Other Ambulatory Visit: Payer: Self-pay | Admitting: Internal Medicine

## 2019-09-09 NOTE — Telephone Encounter (Signed)
Last filled 07-25-19 #30 Last OV 12-21-18 Next OV 12-23-19 [facility]

## 2019-10-04 ENCOUNTER — Other Ambulatory Visit: Payer: Self-pay | Admitting: Internal Medicine

## 2019-10-04 DIAGNOSIS — G2581 Restless legs syndrome: Secondary | ICD-10-CM

## 2019-10-13 ENCOUNTER — Other Ambulatory Visit: Payer: Self-pay | Admitting: Internal Medicine

## 2019-10-13 NOTE — Telephone Encounter (Signed)
Last filled 09-09-19 #30 Last OV 12-21-18 Next OV 12-23-19 [facility]

## 2019-11-24 ENCOUNTER — Other Ambulatory Visit: Payer: Self-pay | Admitting: Internal Medicine

## 2019-11-24 NOTE — Telephone Encounter (Signed)
Last filled 10-20-19 #30 Last OV 12-21-18 Next OV 12-23-19 [facility]

## 2019-12-23 ENCOUNTER — Encounter: Payer: Self-pay | Admitting: Internal Medicine

## 2019-12-23 ENCOUNTER — Ambulatory Visit (INDEPENDENT_AMBULATORY_CARE_PROVIDER_SITE_OTHER): Payer: Medicare Other | Admitting: Internal Medicine

## 2019-12-23 ENCOUNTER — Other Ambulatory Visit: Payer: Self-pay

## 2019-12-23 VITALS — BP 140/76 | HR 60 | Temp 98.0°F | Ht 67.0 in | Wt 173.0 lb

## 2019-12-23 DIAGNOSIS — Z0000 Encounter for general adult medical examination without abnormal findings: Secondary | ICD-10-CM | POA: Diagnosis not present

## 2019-12-23 DIAGNOSIS — M1731 Unilateral post-traumatic osteoarthritis, right knee: Secondary | ICD-10-CM | POA: Diagnosis not present

## 2019-12-23 DIAGNOSIS — Z23 Encounter for immunization: Secondary | ICD-10-CM

## 2019-12-23 DIAGNOSIS — K219 Gastro-esophageal reflux disease without esophagitis: Secondary | ICD-10-CM | POA: Diagnosis not present

## 2019-12-23 DIAGNOSIS — Z7189 Other specified counseling: Secondary | ICD-10-CM

## 2019-12-23 DIAGNOSIS — E785 Hyperlipidemia, unspecified: Secondary | ICD-10-CM | POA: Diagnosis not present

## 2019-12-23 DIAGNOSIS — G2581 Restless legs syndrome: Secondary | ICD-10-CM

## 2019-12-23 DIAGNOSIS — I700 Atherosclerosis of aorta: Secondary | ICD-10-CM

## 2019-12-23 LAB — HEPATIC FUNCTION PANEL
ALT: 13 U/L (ref 0–53)
AST: 18 U/L (ref 0–37)
Albumin: 4.3 g/dL (ref 3.5–5.2)
Alkaline Phosphatase: 59 U/L (ref 39–117)
Bilirubin, Direct: 0.1 mg/dL (ref 0.0–0.3)
Total Bilirubin: 0.5 mg/dL (ref 0.2–1.2)
Total Protein: 7.1 g/dL (ref 6.0–8.3)

## 2019-12-23 LAB — RENAL FUNCTION PANEL
Albumin: 4.3 g/dL (ref 3.5–5.2)
BUN: 12 mg/dL (ref 6–23)
CO2: 31 mEq/L (ref 19–32)
Calcium: 9.5 mg/dL (ref 8.4–10.5)
Chloride: 104 mEq/L (ref 96–112)
Creatinine, Ser: 1.14 mg/dL (ref 0.40–1.50)
GFR: 67.72 mL/min (ref >60.00)
Glucose, Bld: 91 mg/dL (ref 70–99)
Phosphorus: 2.8 mg/dL (ref 2.3–4.6)
Potassium: 4.3 mEq/L (ref 3.5–5.1)
Sodium: 140 mEq/L (ref 135–145)

## 2019-12-23 LAB — LIPID PANEL
Cholesterol: 192 mg/dL (ref 0–200)
HDL: 55.5 mg/dL (ref >39.00)
LDL Cholesterol: 116 mg/dL — ABNORMAL HIGH (ref 0–99)
NonHDL: 136.69
Total CHOL/HDL Ratio: 3
Triglycerides: 105 mg/dL (ref 0.0–149.0)
VLDL: 21 mg/dL (ref 0.0–40.0)

## 2019-12-23 LAB — CBC
HCT: 44.6 % (ref 39.0–52.0)
Hemoglobin: 14.9 g/dL (ref 13.0–17.0)
MCHC: 33.4 g/dL (ref 30.0–36.0)
MCV: 87.1 fl (ref 78.0–100.0)
Platelets: 235 10*3/uL (ref 150.0–400.0)
RBC: 5.13 Mil/uL (ref 4.22–5.81)
RDW: 16.7 % — ABNORMAL HIGH (ref 11.5–15.5)
WBC: 6 10*3/uL (ref 4.0–10.5)

## 2019-12-23 MED ORDER — SILDENAFIL CITRATE 20 MG PO TABS: 60.0000 mg | ORAL_TABLET | Freq: Every day | ORAL | 11 refills | Status: DC | PRN

## 2019-12-23 MED ORDER — CLONAZEPAM 0.5 MG PO TABS: 0.5000 mg | ORAL_TABLET | Freq: Every evening | ORAL | 0 refills | Status: DC | PRN

## 2019-12-23 MED ORDER — GABAPENTIN 600 MG PO TABS: 1200.0000 mg | ORAL_TABLET | Freq: Every day | ORAL | 3 refills | Status: DC

## 2019-12-23 NOTE — Assessment & Plan Note (Signed)
Does okay with daily omeprazole

## 2019-12-23 NOTE — Assessment & Plan Note (Signed)
See social history 

## 2019-12-23 NOTE — Addendum Note (Signed)
Addended by: Randall An on: 12/23/2019 01:42 PM   Modules accepted: Orders

## 2019-12-23 NOTE — Assessment & Plan Note (Signed)
Minimal noted on CT 3 years ago Will recheck lipid levels Consider statin (but past Hep C)
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
## 2019-12-23 NOTE — Assessment & Plan Note (Signed)
Uses occasional pain meds Will need leg hardware removed in January--then consider TKR

## 2019-12-23 NOTE — Progress Notes (Signed)
Hearing Screening   125Hz  250Hz  500Hz  1000Hz  2000Hz  3000Hz  4000Hz  6000Hz  8000Hz   Right ear:   25 40 25  25    Left ear:   25 40 25  25      Visual Acuity Screening   Right eye Left eye Both eyes  Without correction:     With correction: 2/30 20/20 20/20

## 2019-12-23 NOTE — Assessment & Plan Note (Signed)
Will recheck  Consider statin if LFTs all normal

## 2019-12-23 NOTE — Assessment & Plan Note (Signed)
Will increase gabapentin back to 1200mg  at night Clonazepam prn

## 2019-12-23 NOTE — Assessment & Plan Note (Signed)
I have personally reviewed the Medicare Annual Wellness questionnaire and have noted 1. The patient's medical and social history 2. Their use of alcohol, tobacco or illicit drugs 3. Their current medications and supplements 4. The patient's functional ability including ADL's, fall risks, home safety risks and hearing or visual             impairment. 5. Diet and physical activities 6. Evidence for depression or mood disorders  The patients weight, height, BMI and visual acuity have been recorded in the chart I have made referrals, counseling and provided education to the patient based review of the above and I have provided the pt with a written personalized care plan for preventive services.  I have provided you with a copy of your personalized plan for preventive services. Please take the time to review along with your updated medication list.  Will do prevnar and flu vaccine COVID vaccine soon Defer PSA to next year Colon 2024 Needs to restart exercise

## 2020-01-25 ENCOUNTER — Other Ambulatory Visit: Payer: Self-pay | Admitting: Internal Medicine

## 2020-01-25 MED ORDER — CLONAZEPAM 0.5 MG PO TABS: 0.5000 mg | ORAL_TABLET | Freq: Every evening | ORAL | 0 refills | Status: DC | PRN

## 2020-01-25 NOTE — Telephone Encounter (Signed)
Pt called in needed a refill of clonazepam  Pharmacy -[facility] [place], [place]

## 2020-01-25 NOTE — Telephone Encounter (Signed)
Last filled 12-23-19 #30 Last OV 12-23-19 Next OV 12-25-20 [facility]  Forwarding to [facility] in Dr Alla German absence

## 2020-02-29 ENCOUNTER — Other Ambulatory Visit: Payer: Self-pay | Admitting: Internal Medicine

## 2020-03-01 NOTE — Telephone Encounter (Signed)
Klonopin 0.5mg  Last filled 01-25-20 #30 Last OV 12-23-19 Next OV 12-25-20 [facility]

## 2020-03-03 ENCOUNTER — Other Ambulatory Visit: Payer: Self-pay | Admitting: Internal Medicine

## 2020-04-05 ENCOUNTER — Other Ambulatory Visit: Payer: Self-pay | Admitting: Internal Medicine

## 2020-04-06 MED ORDER — CLONAZEPAM 0.5 MG PO TABS: 0.5000 mg | ORAL_TABLET | Freq: Every evening | ORAL | 0 refills | Status: DC | PRN

## 2020-04-06 NOTE — Telephone Encounter (Signed)
Last filled 03-04-20 #30 Last OV 12-23-19 Next OV 12-25-20 [facility]

## 2020-05-24 ENCOUNTER — Other Ambulatory Visit: Payer: Self-pay

## 2020-05-24 MED ORDER — CLONAZEPAM 0.5 MG PO TABS: 0.5000 mg | ORAL_TABLET | Freq: Every evening | ORAL | 0 refills | Status: DC | PRN

## 2020-05-24 NOTE — Telephone Encounter (Signed)
Refill request for Clonazepam 0.5 mg tablets  LOV - 12/23/19 Next OV - 12/25/2020 Last refill - 04/06/20 #30/0

## 2020-07-05 ENCOUNTER — Other Ambulatory Visit: Payer: Self-pay | Admitting: Internal Medicine

## 2020-07-05 ENCOUNTER — Other Ambulatory Visit: Payer: Self-pay

## 2020-07-05 NOTE — Telephone Encounter (Signed)
Last filled 05-24-20 #30 Last OV - 12/23/19 Next OV - 12/25/2020 [facility]

## 2020-08-21 ENCOUNTER — Other Ambulatory Visit: Payer: Self-pay | Admitting: Internal Medicine

## 2020-08-21 NOTE — Telephone Encounter (Signed)
Last filled 07-05-20 #30 Last OV 12-23-19 Next OV 12-25-20 [facility]

## 2020-12-25 ENCOUNTER — Encounter: Payer: Medicare Other | Admitting: Internal Medicine

## 2021-02-21 ENCOUNTER — Other Ambulatory Visit: Payer: Self-pay | Admitting: Internal Medicine

## 2021-02-21 MED ORDER — GABAPENTIN 600 MG PO TABS: 1200.0000 mg | ORAL_TABLET | Freq: Every day | ORAL | 3 refills | Status: DC

## 2021-02-21 MED ORDER — CLONAZEPAM 0.5 MG PO TABS: 0.5000 mg | ORAL_TABLET | Freq: Every evening | ORAL | 0 refills | Status: DC | PRN

## 2021-02-21 MED ORDER — SILDENAFIL CITRATE 20 MG PO TABS: 60.0000 mg | ORAL_TABLET | Freq: Every day | ORAL | 11 refills | Status: DC | PRN

## 2021-02-21 NOTE — Addendum Note (Signed)
Addended by: Viviana Simpler I on: 02/21/2021 01:12 PM   Modules accepted: Orders

## 2021-02-21 NOTE — Addendum Note (Signed)
Addended by: Pilar Grammes on: 02/21/2021 09:49 AM   Modules accepted: Orders

## 2021-02-21 NOTE — Telephone Encounter (Signed)
°  Encourage patient to contact the pharmacy for refills or they can request refills through [facility]:  Please schedule appointment if longer than 1 year  NEXT APPOINTMENT DATE:04/01/21  MEDICATION:sildenafil (REVATIO) 20 MG tablet,gabapentin (NEURONTIN) 600 MG tablet,clonazePAM (KLONOPIN) 0.5 MG tablet  Is the patient out of medication?   [id number] [address], [place]  Let patient know to contact pharmacy at the end of the day to make sure medication is ready.  Please notify patient to allow 48-72 hours to process  CLINICAL FILLS OUT ALL BELOW:   LAST REFILL:  QTY:  REFILL DATE:    OTHER COMMENTS:    Okay for refill?  Please advise

## 2021-04-01 ENCOUNTER — Encounter: Payer: Self-pay | Admitting: Internal Medicine

## 2021-04-01 ENCOUNTER — Ambulatory Visit (INDEPENDENT_AMBULATORY_CARE_PROVIDER_SITE_OTHER): Payer: Medicare HMO | Admitting: Internal Medicine

## 2021-04-01 ENCOUNTER — Other Ambulatory Visit: Payer: Self-pay

## 2021-04-01 VITALS — BP 126/84 | HR 95 | Temp 98.6°F | Ht 66.5 in | Wt 155.0 lb

## 2021-04-01 DIAGNOSIS — K219 Gastro-esophageal reflux disease without esophagitis: Secondary | ICD-10-CM | POA: Diagnosis not present

## 2021-04-01 DIAGNOSIS — Z125 Encounter for screening for malignant neoplasm of prostate: Secondary | ICD-10-CM | POA: Diagnosis not present

## 2021-04-01 DIAGNOSIS — Z0000 Encounter for general adult medical examination without abnormal findings: Secondary | ICD-10-CM | POA: Diagnosis not present

## 2021-04-01 DIAGNOSIS — Z7189 Other specified counseling: Secondary | ICD-10-CM | POA: Diagnosis not present

## 2021-04-01 DIAGNOSIS — Z8619 Personal history of other infectious and parasitic diseases: Secondary | ICD-10-CM | POA: Diagnosis not present

## 2021-04-01 DIAGNOSIS — I700 Atherosclerosis of aorta: Secondary | ICD-10-CM | POA: Diagnosis not present

## 2021-04-01 DIAGNOSIS — G2581 Restless legs syndrome: Secondary | ICD-10-CM

## 2021-04-01 MED ORDER — SILDENAFIL CITRATE 20 MG PO TABS: 60.0000 mg | ORAL_TABLET | Freq: Every day | ORAL | 11 refills | Status: DC | PRN

## 2021-04-01 MED ORDER — CLONAZEPAM 0.5 MG PO TABS: 0.5000 mg | ORAL_TABLET | Freq: Every evening | ORAL | 0 refills | Status: DC | PRN

## 2021-04-01 NOTE — Assessment & Plan Note (Signed)
See social history Blank forms given 
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
## 2021-04-01 NOTE — Assessment & Plan Note (Signed)
Not excited about statin due to past liver issues

## 2021-04-01 NOTE — Assessment & Plan Note (Signed)
Ongoing issues despite the gabapentin 1200mg  at bedtime Mind tends to wander, etc Will have him use the clonazepam for bad night (0.5mg )

## 2021-04-01 NOTE — Assessment & Plan Note (Signed)
Did get viral cure Will recheck liver tests

## 2021-04-01 NOTE — Assessment & Plan Note (Signed)
Does well with daily omeprazole 20mg 

## 2021-04-01 NOTE — Progress Notes (Signed)
Vision Screening   Right eye Left eye Both eyes  Without correction 20/20 20/25 20/20   With correction     Hearing Screening - Comments:: Passed whisper test

## 2021-04-01 NOTE — Assessment & Plan Note (Signed)
I have personally reviewed the Medicare Annual Wellness questionnaire and have noted 1. The patient's medical and social history 2. Their use of alcohol, tobacco or illicit drugs 3. Their current medications and supplements 4. The patient's functional ability including ADL's, fall risks, home safety risks and hearing or visual             impairment. 5. Diet and physical activities 6. Evidence for depression or mood disorders  The patients weight, height, BMI and visual acuity have been recorded in the chart I have made referrals, counseling and provided education to the patient based review of the above and I have provided the pt with a written personalized care plan for preventive services.  I have provided you with a copy of your personalized plan for preventive services. Please take the time to review along with your updated medication list.  Colon due 2024 Will check PSA Recommended bivalent COVID Defer flu vaccine to fall Discussed restarting work outs at [employer]

## 2021-04-02 LAB — PSA, MEDICARE: PSA: 1.23 ng/ml (ref 0.10–4.00)

## 2021-04-02 LAB — HEPATIC FUNCTION PANEL
ALT: 9 U/L (ref 0–53)
AST: 12 U/L (ref 0–37)
Albumin: 4.2 g/dL (ref 3.5–5.2)
Alkaline Phosphatase: 59 U/L (ref 39–117)
Bilirubin, Direct: 0.1 mg/dL (ref 0.0–0.3)
Total Bilirubin: 0.4 mg/dL (ref 0.2–1.2)
Total Protein: 7 g/dL (ref 6.0–8.3)

## 2021-04-02 LAB — RENAL FUNCTION PANEL
Albumin: 4.2 g/dL (ref 3.5–5.2)
BUN: 10 mg/dL (ref 6–23)
CO2: 34 mEq/L — ABNORMAL HIGH (ref 19–32)
Calcium: 9.4 mg/dL (ref 8.4–10.5)
Chloride: 104 mEq/L (ref 96–112)
Creatinine, Ser: 1.07 mg/dL (ref 0.40–1.50)
GFR: 72.42 mL/min (ref >60.00)
Glucose, Bld: 95 mg/dL (ref 70–99)
Phosphorus: 2.9 mg/dL (ref 2.3–4.6)
Potassium: 4.5 mEq/L (ref 3.5–5.1)
Sodium: 140 mEq/L (ref 135–145)

## 2021-04-02 LAB — CBC
HCT: 46.3 % (ref 39.0–52.0)
Hemoglobin: 15.2 g/dL (ref 13.0–17.0)
MCHC: 32.9 g/dL (ref 30.0–36.0)
MCV: 92.7 fl (ref 78.0–100.0)
Platelets: 229 10*3/uL (ref 150.0–400.0)
RBC: 5 Mil/uL (ref 4.22–5.81)
RDW: 14.1 % (ref 11.5–15.5)
WBC: 4.2 10*3/uL (ref 4.0–10.5)

## 2021-05-10 ENCOUNTER — Other Ambulatory Visit: Payer: Self-pay | Admitting: Internal Medicine

## 2021-05-10 MED ORDER — CLONAZEPAM 0.5 MG PO TABS: 0.5000 mg | ORAL_TABLET | Freq: Every evening | ORAL | 0 refills | Status: DC | PRN

## 2021-05-10 NOTE — Telephone Encounter (Signed)
Last filled 01-01-21 #30 ?Last OV 04-01-21 ?Next OV 04-02-22 ?[facility] ?

## 2021-08-22 ENCOUNTER — Other Ambulatory Visit: Payer: Self-pay | Admitting: Internal Medicine

## 2021-09-16 ENCOUNTER — Other Ambulatory Visit: Payer: Self-pay | Admitting: Internal Medicine

## 2021-09-17 NOTE — Telephone Encounter (Signed)
Last filled 05-10-21 #30 Last OV 04-01-21 Next OV 04-02-22 [facility]

## 2021-09-23 ENCOUNTER — Encounter: Payer: Self-pay | Admitting: Internal Medicine

## 2021-09-24 MED ORDER — TADALAFIL 20 MG PO TABS: 10.0000 mg | ORAL_TABLET | ORAL | 11 refills | Status: DC | PRN

## 2021-11-18 ENCOUNTER — Encounter: Payer: Self-pay | Admitting: Internal Medicine

## 2021-12-01 ENCOUNTER — Other Ambulatory Visit: Payer: Self-pay | Admitting: Internal Medicine

## 2021-12-04 ENCOUNTER — Other Ambulatory Visit: Payer: Self-pay | Admitting: Internal Medicine

## 2021-12-09 DIAGNOSIS — H524 Presbyopia: Secondary | ICD-10-CM | POA: Diagnosis not present

## 2021-12-15 ENCOUNTER — Other Ambulatory Visit: Payer: Self-pay | Admitting: Internal Medicine

## 2022-02-23 IMAGING — RF DG C-ARM 1-60 MIN
1 series · 7 of 7 positions shown · non-contrast
Comparison: January 20, 2019.

CLINICAL DATA: Removal of tip fib nail.

EXAM:
RIGHT TIBIA AND FIBULA - 2 VIEW; DG C-ARM 1-60 MIN
FLUOROSCOPY TIME:  1 minutes 40 seconds.

[Series 1: run · 7 of 7 slices shown]
[im 1/7]
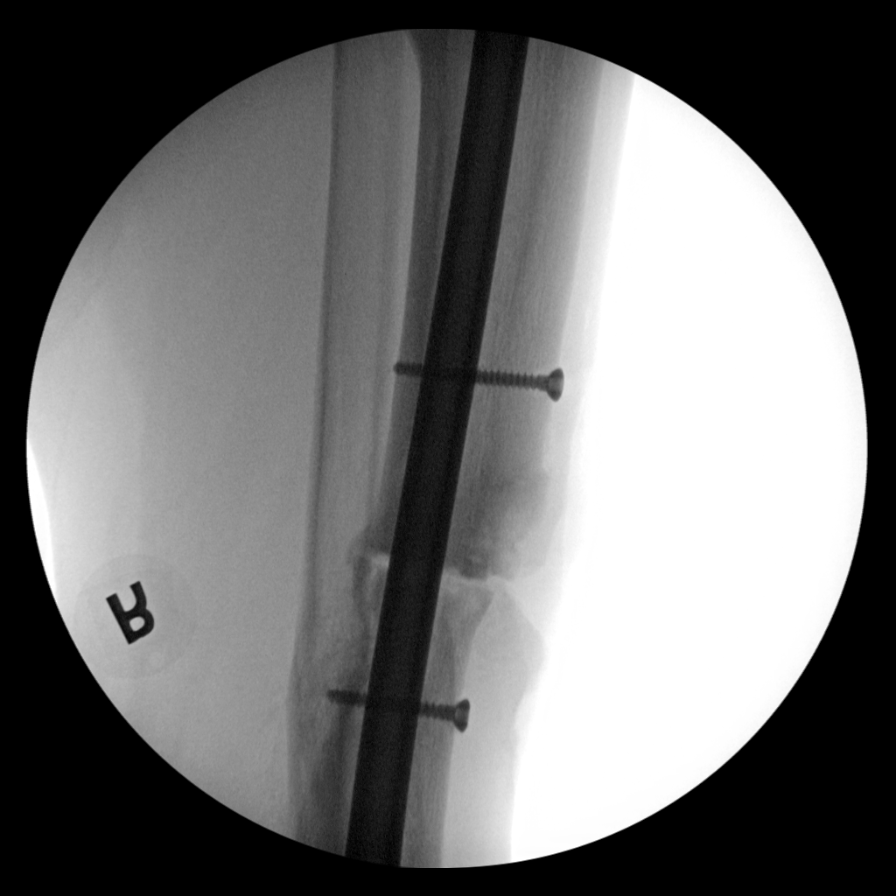
[im 2/7]
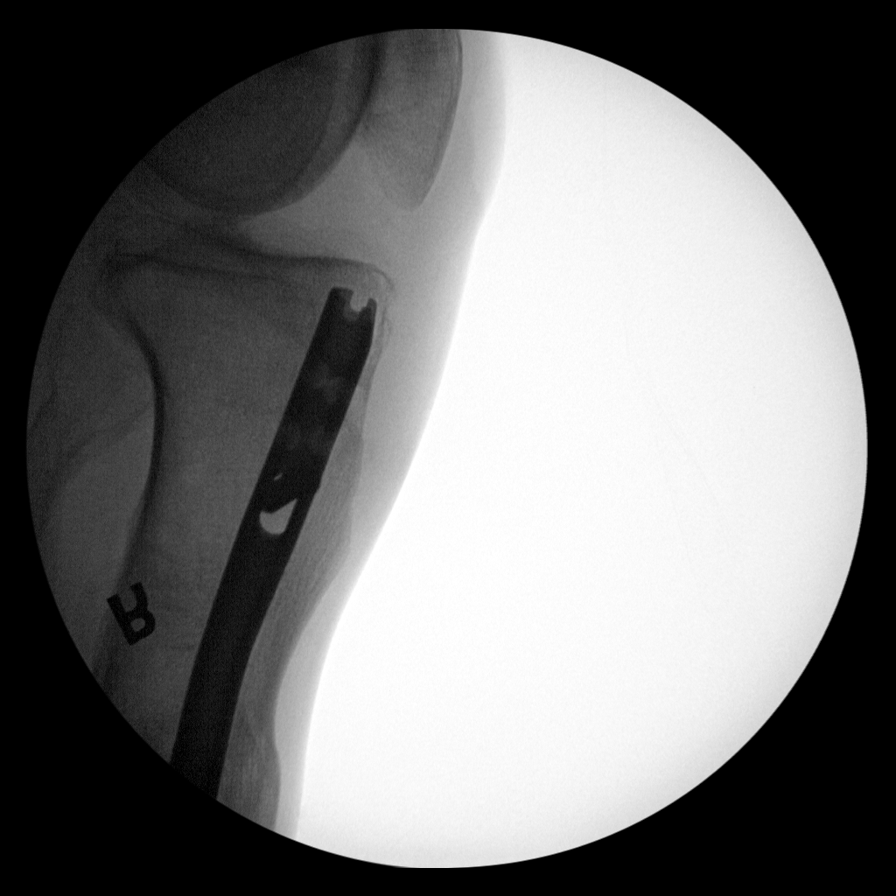
[im 3/7]
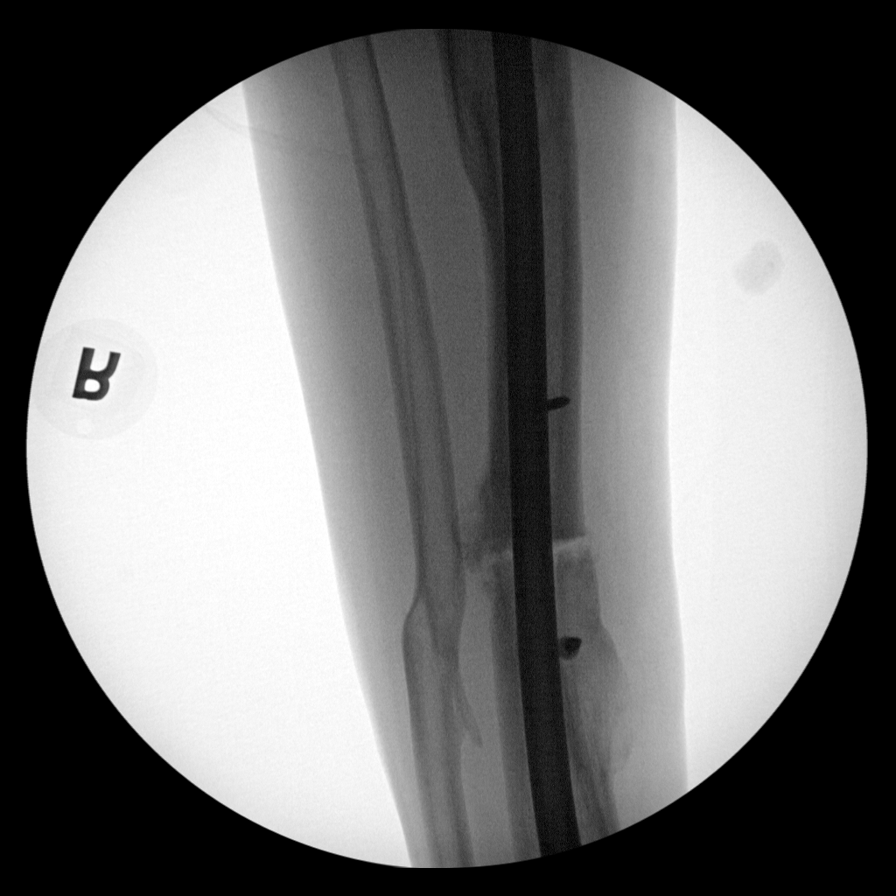
[im 4/7]
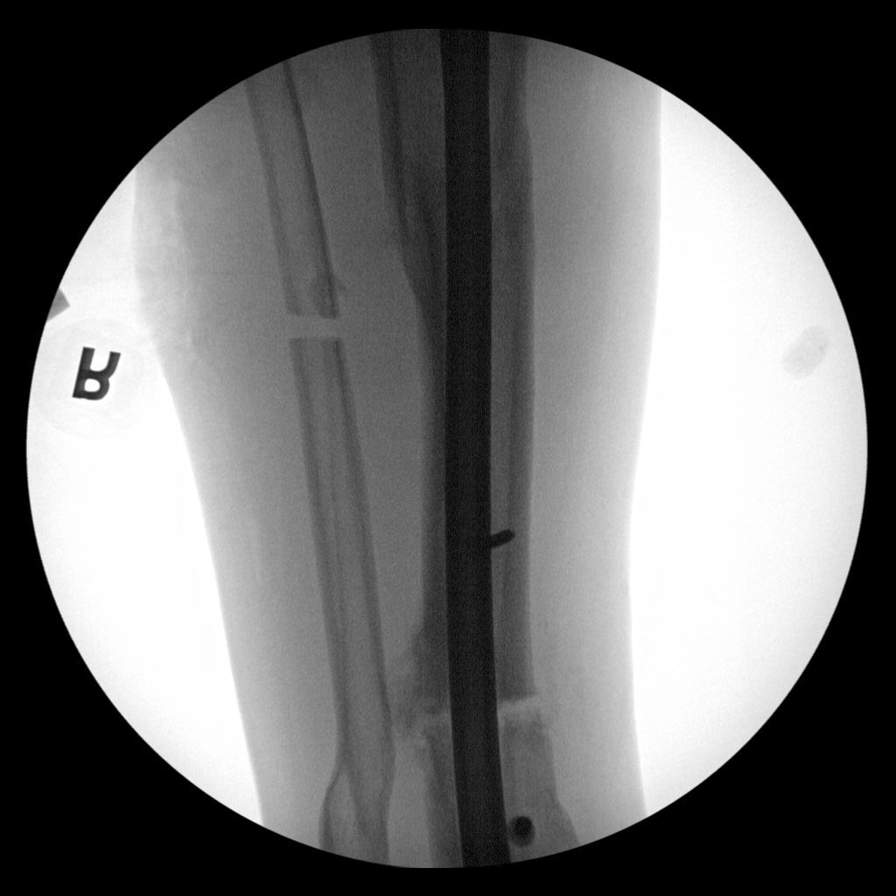
[im 5/7]
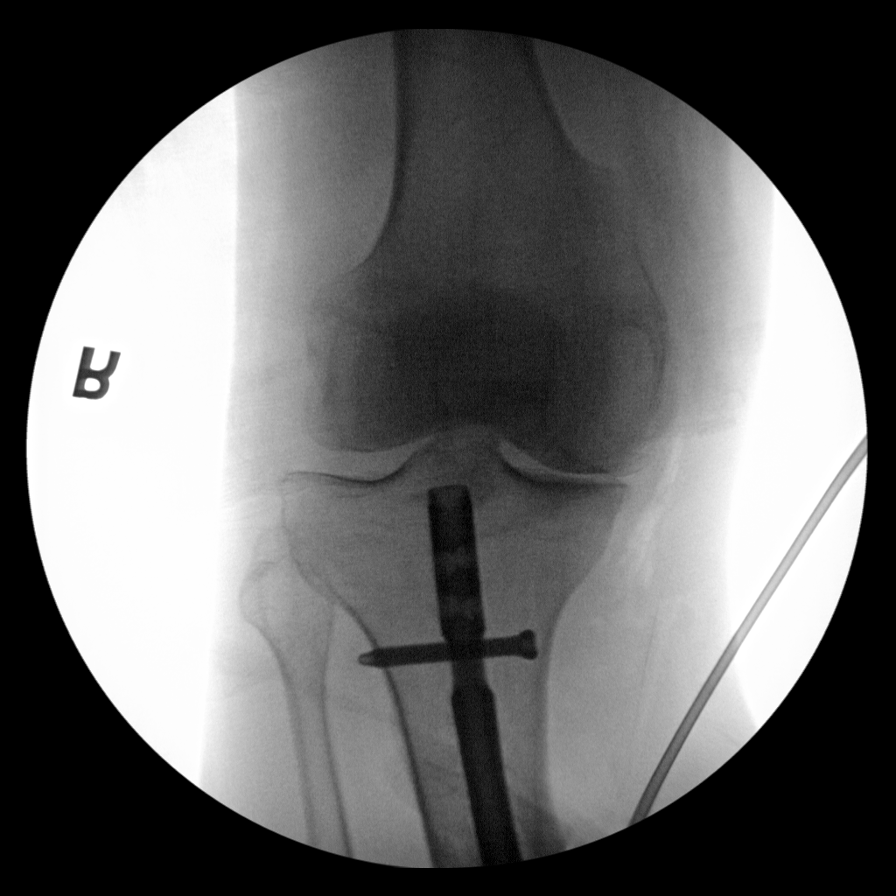
[im 6/7]
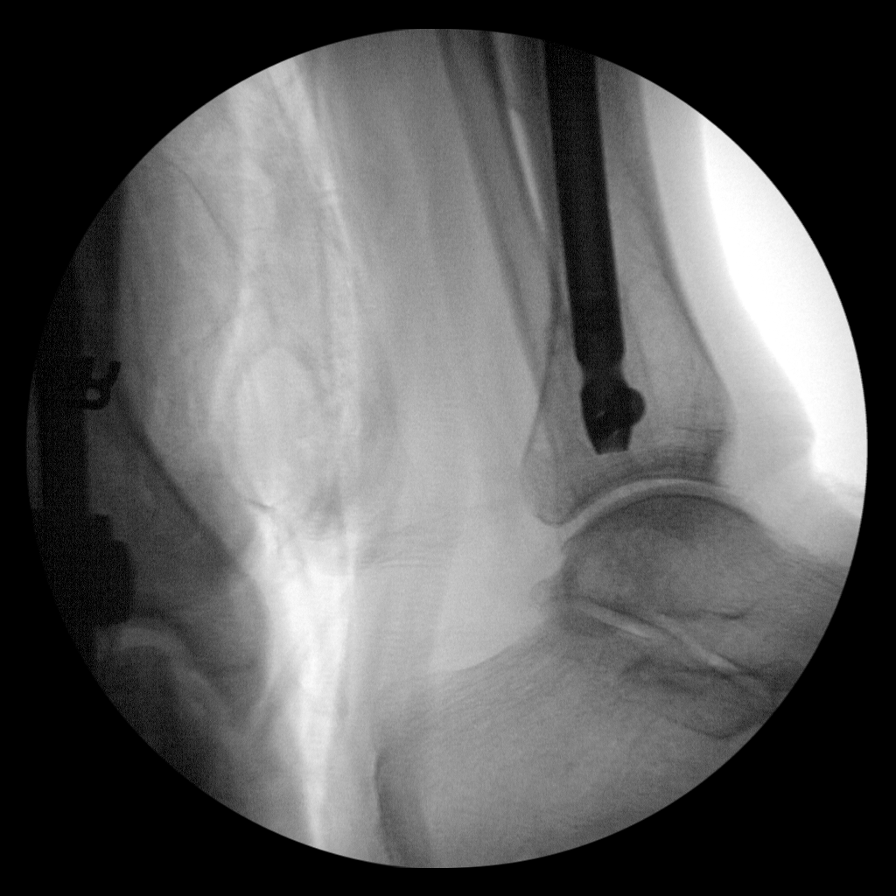
[im 7/7]
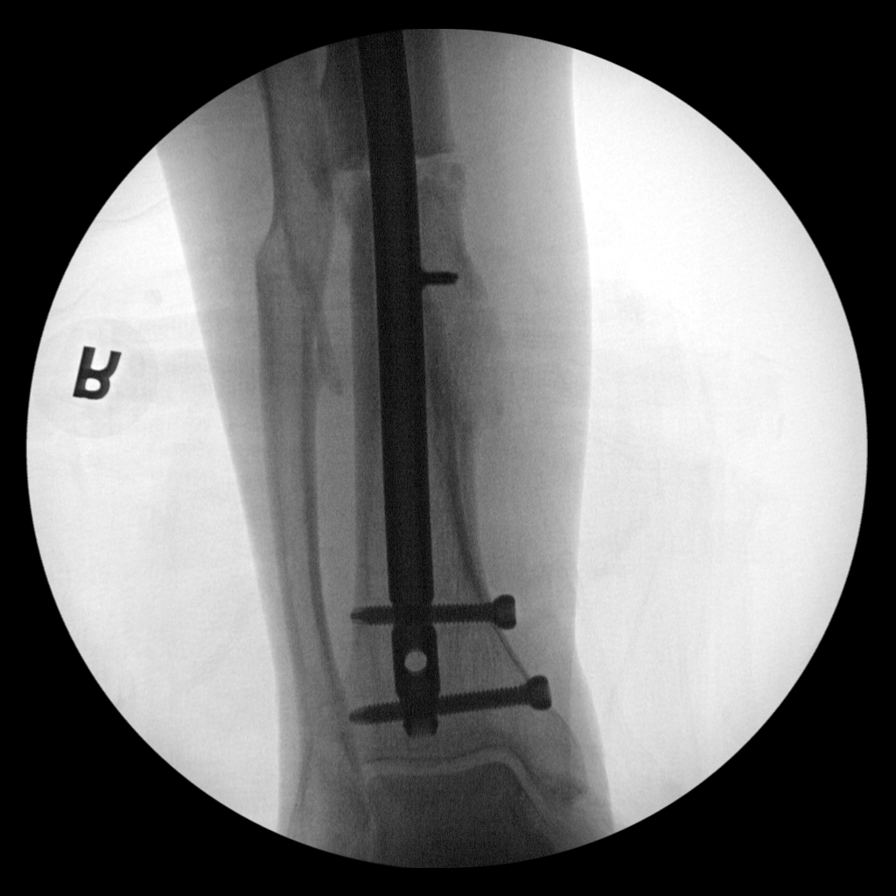

[7 of 7 positions shown; findings below may reference images not displayed]

FINDINGS: Seven intraoperative fluoroscopic images were obtained of the right
tibia and fibula. These images demonstrate intramedullary rod
fixation of old right distal tibial fracture. Old healed right
fibular fracture is noted as well.
IMPRESSION: Status post intramedullary rod fixation of right distal tibial
fracture.

## 2022-02-23 IMAGING — DX DG TIBIA/FIBULA PORT 2V*R*
4 series · 4 of 4 positions shown · non-contrast
Comparison: January 20, 2019

CLINICAL DATA: Fracture

EXAM:
PORTABLE RIGHT TIBIA AND FIBULA - 2 VIEW

[tibia ap (1 of 2)]
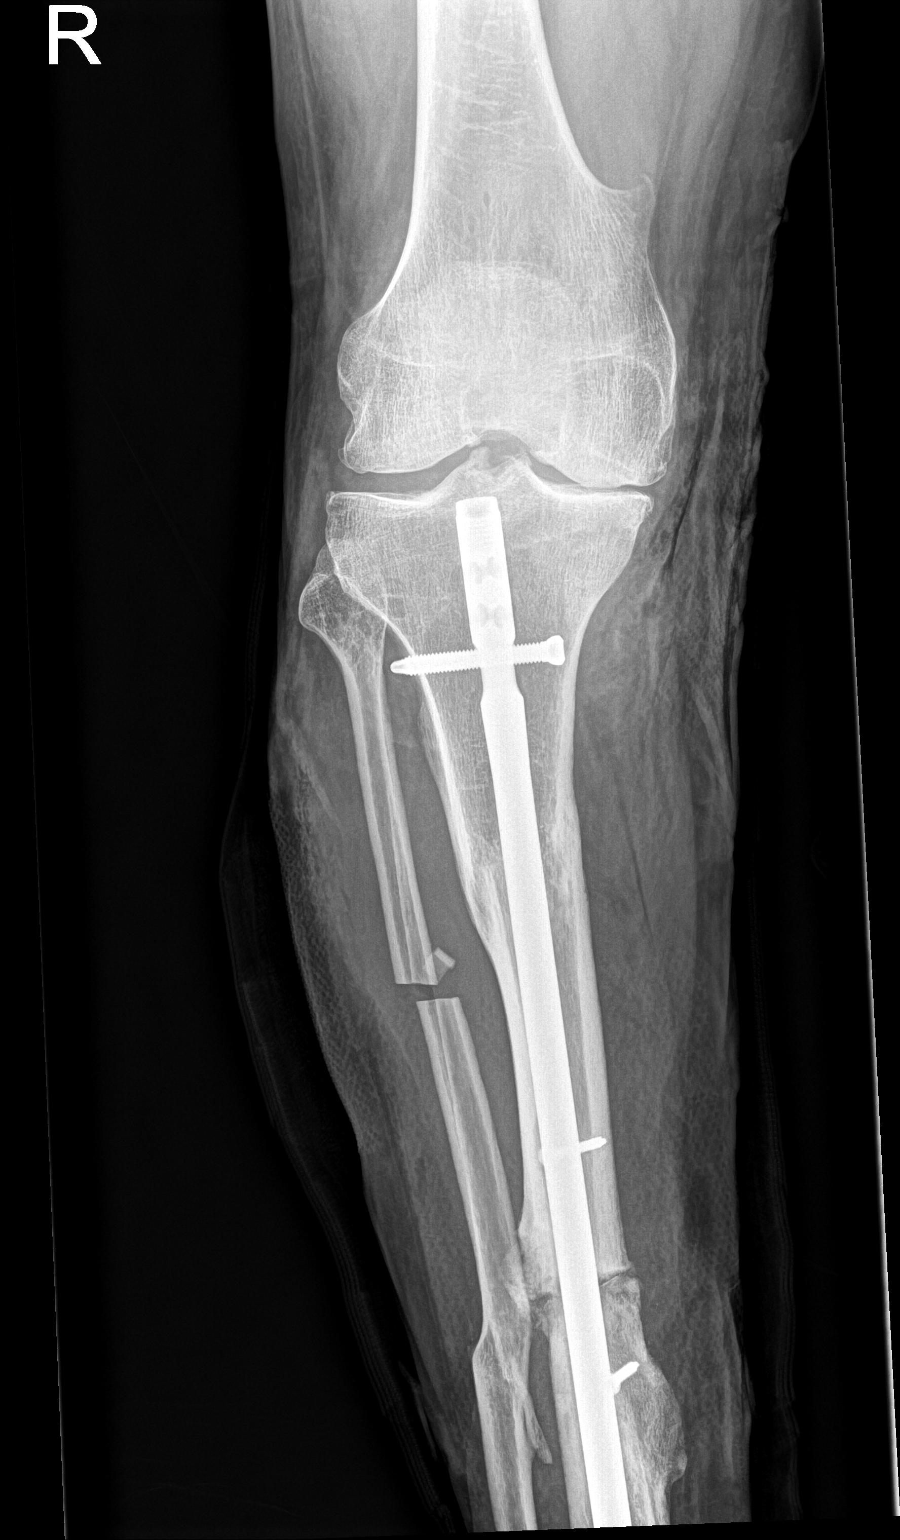

[tibia ap (2 of 2)]
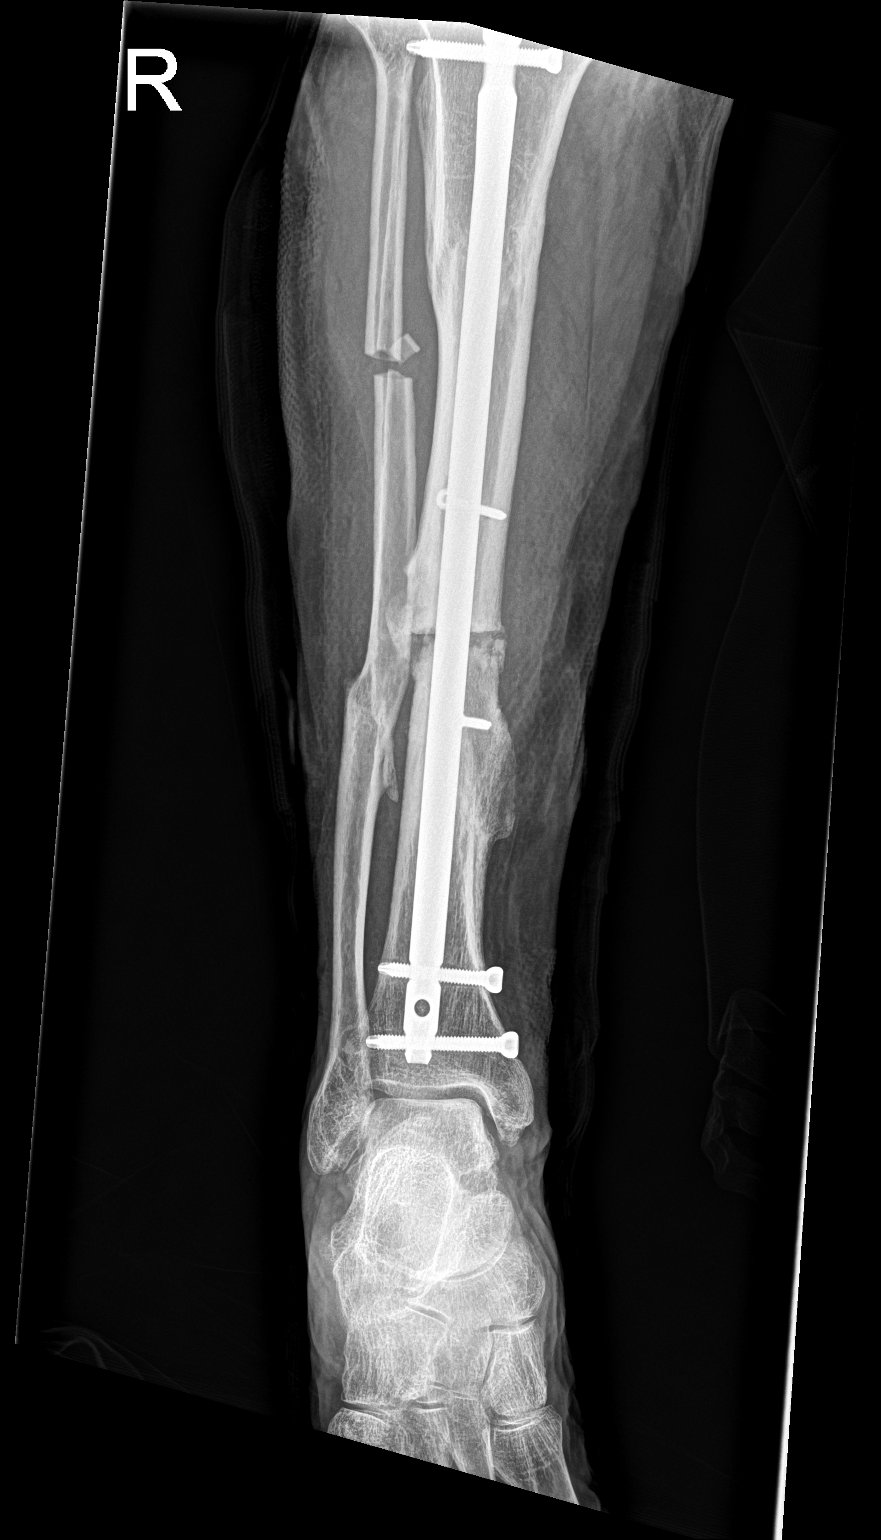

[tibia lat (1 of 2)]
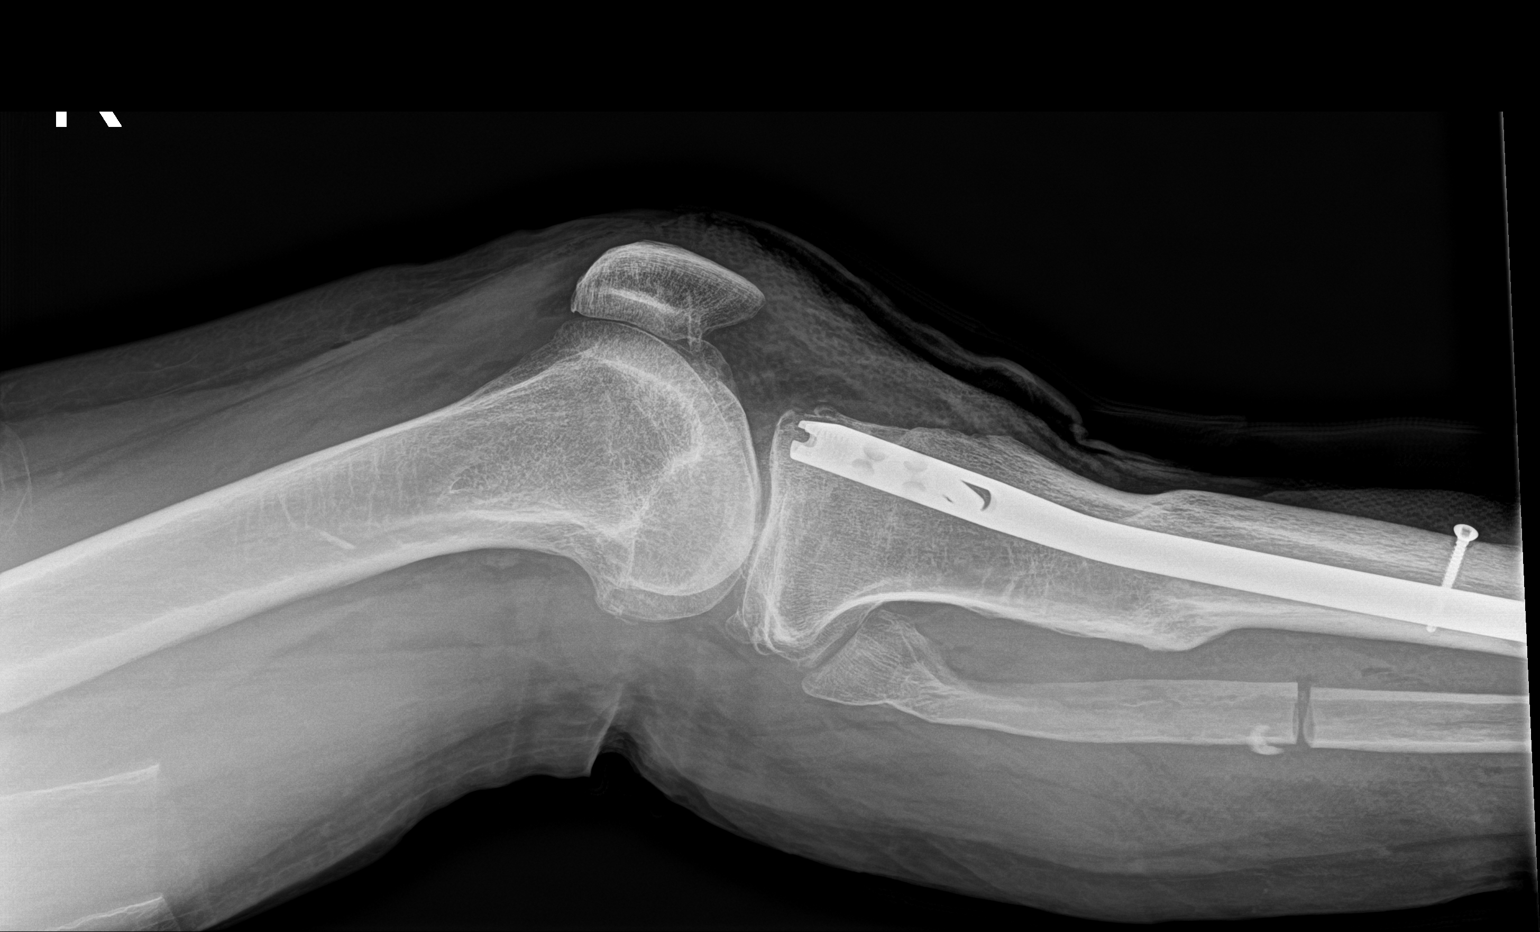

[tibia lat (2 of 2)]
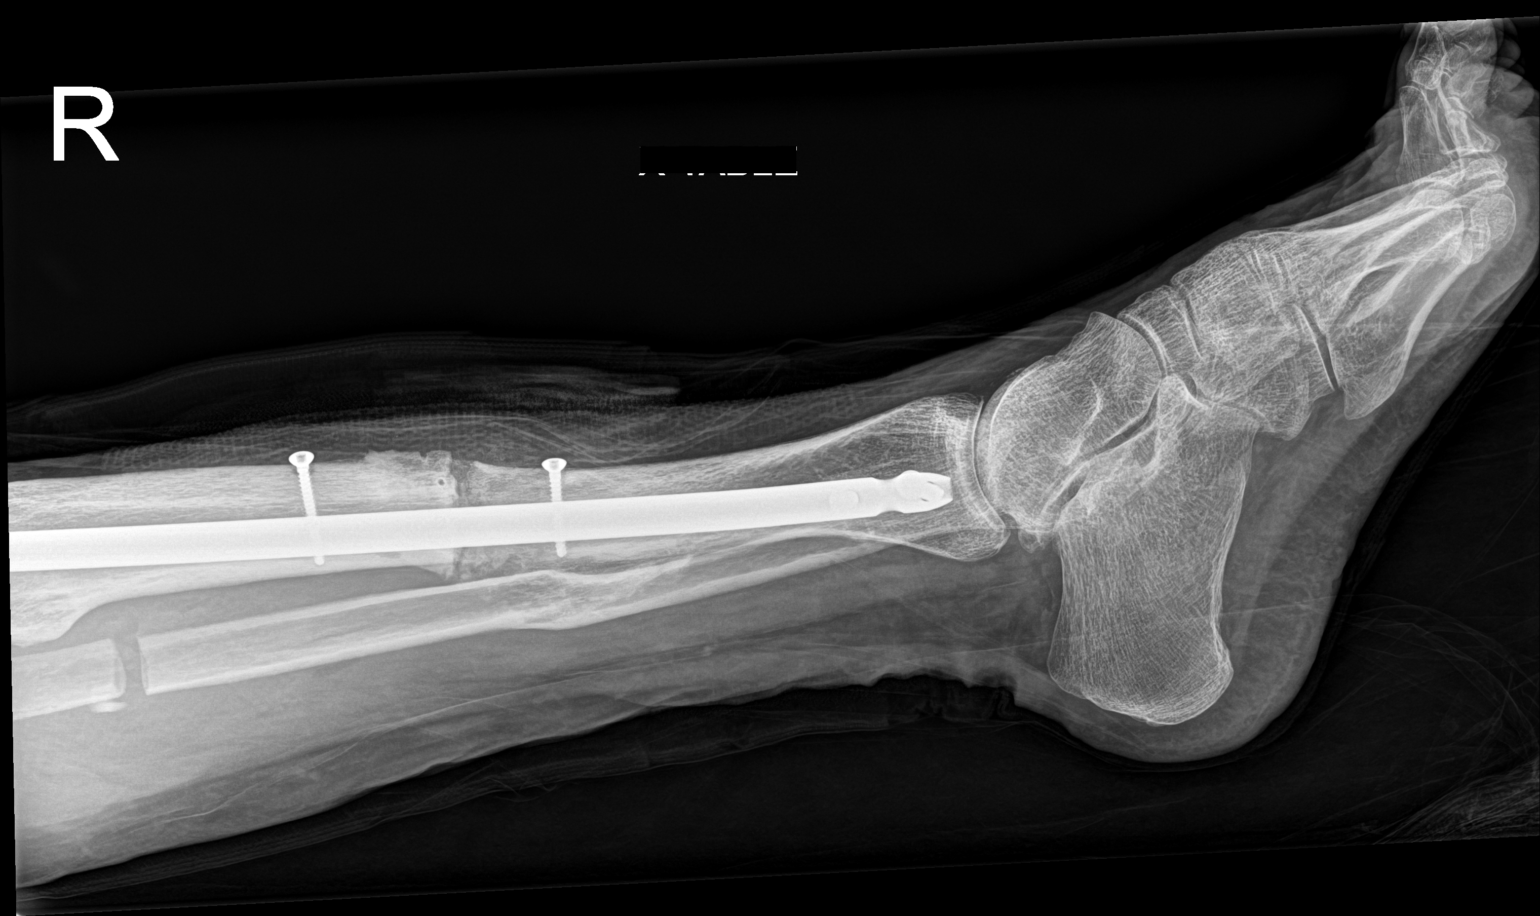

[4 of 4 positions shown; findings below may reference images not displayed]

FINDINGS: The patient has undergone intramedullary nail placement through the
tibia. Again noted is a fracture of the mid tibial diaphysis with
evidence for nonunion. There is a healed fracture of the fibula.
There is a new osteotomy site involving the proximal fibular
diaphysis. There is surrounding soft tissue swelling. There are
pockets of subcutaneous gas.
IMPRESSION: Postsurgical changes as above.

## 2022-02-24 ENCOUNTER — Other Ambulatory Visit: Payer: Self-pay | Admitting: Internal Medicine

## 2022-02-25 MED ORDER — CLONAZEPAM 0.5 MG PO TABS: 0.5000 mg | ORAL_TABLET | Freq: Every evening | ORAL | 0 refills | Status: DC | PRN

## 2022-02-25 NOTE — Telephone Encounter (Signed)
Last filled 09-17-21 #30 Last OV 04-01-21 Next OV 04-02-22 [facility]

## 2022-03-04 ENCOUNTER — Other Ambulatory Visit: Payer: Self-pay | Admitting: Internal Medicine

## 2022-04-02 ENCOUNTER — Encounter: Payer: Self-pay | Admitting: Internal Medicine

## 2022-04-02 ENCOUNTER — Ambulatory Visit (INDEPENDENT_AMBULATORY_CARE_PROVIDER_SITE_OTHER): Payer: Medicare HMO | Admitting: Internal Medicine

## 2022-04-02 VITALS — BP 136/78 | HR 104 | Temp 98.3°F | Ht 67.0 in | Wt 159.0 lb

## 2022-04-02 DIAGNOSIS — Z125 Encounter for screening for malignant neoplasm of prostate: Secondary | ICD-10-CM | POA: Diagnosis not present

## 2022-04-02 DIAGNOSIS — Z0000 Encounter for general adult medical examination without abnormal findings: Secondary | ICD-10-CM

## 2022-04-02 DIAGNOSIS — M25561 Pain in right knee: Secondary | ICD-10-CM | POA: Diagnosis not present

## 2022-04-02 DIAGNOSIS — G2581 Restless legs syndrome: Secondary | ICD-10-CM | POA: Diagnosis not present

## 2022-04-02 DIAGNOSIS — Z23 Encounter for immunization: Secondary | ICD-10-CM

## 2022-04-02 DIAGNOSIS — K2100 Gastro-esophageal reflux disease with esophagitis, without bleeding: Secondary | ICD-10-CM | POA: Diagnosis not present

## 2022-04-02 DIAGNOSIS — Z8619 Personal history of other infectious and parasitic diseases: Secondary | ICD-10-CM | POA: Diagnosis not present

## 2022-04-02 DIAGNOSIS — I700 Atherosclerosis of aorta: Secondary | ICD-10-CM | POA: Diagnosis not present

## 2022-04-02 DIAGNOSIS — G8929 Other chronic pain: Secondary | ICD-10-CM | POA: Diagnosis not present

## 2022-04-02 LAB — RENAL FUNCTION PANEL
Albumin: 4.4 g/dL (ref 3.5–5.2)
BUN: 11 mg/dL (ref 6–23)
CO2: 32 mEq/L (ref 19–32)
Calcium: 9.3 mg/dL (ref 8.4–10.5)
Chloride: 102 mEq/L (ref 96–112)
Creatinine, Ser: 1.11 mg/dL (ref 0.40–1.50)
GFR: 68.81 mL/min (ref >60.00)
Glucose, Bld: 102 mg/dL — ABNORMAL HIGH (ref 70–99)
Phosphorus: 2.3 mg/dL (ref 2.3–4.6)
Potassium: 4.3 mEq/L (ref 3.5–5.1)
Sodium: 140 mEq/L (ref 135–145)

## 2022-04-02 LAB — HEPATIC FUNCTION PANEL
ALT: 12 U/L (ref 0–53)
AST: 18 U/L (ref 0–37)
Albumin: 4.4 g/dL (ref 3.5–5.2)
Alkaline Phosphatase: 47 U/L (ref 39–117)
Bilirubin, Direct: 0.2 mg/dL (ref 0.0–0.3)
Total Bilirubin: 1.1 mg/dL (ref 0.2–1.2)
Total Protein: 7.3 g/dL (ref 6.0–8.3)

## 2022-04-02 LAB — CBC
HCT: 44.7 % (ref 39.0–52.0)
Hemoglobin: 15.1 g/dL (ref 13.0–17.0)
MCHC: 33.7 g/dL (ref 30.0–36.0)
MCV: 94.8 fl (ref 78.0–100.0)
Platelets: 237 10*3/uL (ref 150.0–400.0)
RBC: 4.71 Mil/uL (ref 4.22–5.81)
RDW: 13.1 % (ref 11.5–15.5)
WBC: 5.6 10*3/uL (ref 4.0–10.5)

## 2022-04-02 LAB — PSA, MEDICARE: PSA: 1.7 ng/ml (ref 0.10–4.00)

## 2022-04-02 MED ORDER — CLONAZEPAM 0.5 MG PO TABS: 0.5000 mg | ORAL_TABLET | Freq: Every evening | ORAL | 0 refills | Status: DC | PRN

## 2022-04-02 MED ORDER — SILDENAFIL CITRATE 100 MG PO TABS: 100.0000 mg | ORAL_TABLET | Freq: Every day | ORAL | 11 refills | Status: DC | PRN
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
## 2022-04-02 NOTE — Assessment & Plan Note (Signed)
Not ready for TKR  Uses tylenol rarely

## 2022-04-02 NOTE — Assessment & Plan Note (Signed)
Fair control on daily omeprazole 20 Adds pepcid AC occasionally

## 2022-04-02 NOTE — Assessment & Plan Note (Signed)
Breakthrough despite gabapentin '1200mg'$  daily Okay to use clonazepam 0.5-'1mg'$  prn

## 2022-04-02 NOTE — Progress Notes (Signed)
Hearing Screening - Comments:: Passed whisper test Vision Screening - Comments:: October 2023

## 2022-04-02 NOTE — Assessment & Plan Note (Addendum)
I have personally reviewed the Medicare Annual Wellness questionnaire and have noted 1. The patient's medical and social history 2. Their use of alcohol, tobacco or illicit drugs 3. Their current medications and supplements 4. The patient's functional ability including ADL's, fall risks, home safety risks and hearing or visual             impairment. 5. Diet and physical activities 6. Evidence for depression or mood disorders  The patients weight, height, BMI and visual acuity have been recorded in the chart I have made referrals, counseling and provided education to the patient based review of the above and I have provided the pt with a written personalized care plan for preventive services.  I have provided you with a copy of your personalized plan for preventive services. Please take the time to review along with your updated medication list.  Colon due in June Will recheck PSA Discussed adding resistance training Should get flu and COVID vaccines Prevnar 20 today

## 2022-04-02 NOTE — Addendum Note (Signed)
Addended by: Pilar Grammes on: 04/02/2022 11:57 AM   Modules accepted: Orders

## 2022-04-02 NOTE — Assessment & Plan Note (Signed)
No recurrence. 

## 2022-04-02 NOTE — Assessment & Plan Note (Signed)
Due to liver problems, wants to avoid statin

## 2022-04-03 HISTORY — PX: CORONARY STENT INTERVENTION: CATH118234

## 2022-04-12 DIAGNOSIS — Z043 Encounter for examination and observation following other accident: Secondary | ICD-10-CM | POA: Diagnosis not present

## 2022-04-12 DIAGNOSIS — F1410 Cocaine abuse, uncomplicated: Secondary | ICD-10-CM | POA: Diagnosis not present

## 2022-04-12 DIAGNOSIS — R9431 Abnormal electrocardiogram [ECG] [EKG]: Secondary | ICD-10-CM | POA: Diagnosis not present

## 2022-04-12 DIAGNOSIS — I480 Paroxysmal atrial fibrillation: Secondary | ICD-10-CM | POA: Diagnosis not present

## 2022-04-12 DIAGNOSIS — I10 Essential (primary) hypertension: Secondary | ICD-10-CM | POA: Diagnosis not present

## 2022-04-12 DIAGNOSIS — I472 Ventricular tachycardia, unspecified: Secondary | ICD-10-CM | POA: Diagnosis not present

## 2022-04-12 DIAGNOSIS — E785 Hyperlipidemia, unspecified: Secondary | ICD-10-CM | POA: Diagnosis not present

## 2022-04-12 DIAGNOSIS — S0993XA Unspecified injury of face, initial encounter: Secondary | ICD-10-CM | POA: Diagnosis not present

## 2022-04-12 DIAGNOSIS — R079 Chest pain, unspecified: Secondary | ICD-10-CM | POA: Diagnosis not present

## 2022-04-12 DIAGNOSIS — I219 Acute myocardial infarction, unspecified: Secondary | ICD-10-CM | POA: Diagnosis not present

## 2022-04-12 DIAGNOSIS — I213 ST elevation (STEMI) myocardial infarction of unspecified site: Secondary | ICD-10-CM | POA: Diagnosis not present

## 2022-04-12 DIAGNOSIS — I2119 ST elevation (STEMI) myocardial infarction involving other coronary artery of inferior wall: Secondary | ICD-10-CM | POA: Insufficient documentation

## 2022-04-12 DIAGNOSIS — F419 Anxiety disorder, unspecified: Secondary | ICD-10-CM | POA: Diagnosis not present

## 2022-04-12 DIAGNOSIS — N390 Urinary tract infection, site not specified: Secondary | ICD-10-CM | POA: Diagnosis not present

## 2022-04-12 DIAGNOSIS — I4891 Unspecified atrial fibrillation: Secondary | ICD-10-CM | POA: Diagnosis not present

## 2022-04-12 DIAGNOSIS — I2583 Coronary atherosclerosis due to lipid rich plaque: Secondary | ICD-10-CM | POA: Diagnosis not present

## 2022-04-12 DIAGNOSIS — K219 Gastro-esophageal reflux disease without esophagitis: Secondary | ICD-10-CM | POA: Diagnosis not present

## 2022-04-12 DIAGNOSIS — I5022 Chronic systolic (congestive) heart failure: Secondary | ICD-10-CM | POA: Diagnosis not present

## 2022-04-12 DIAGNOSIS — Z743 Need for continuous supervision: Secondary | ICD-10-CM | POA: Diagnosis not present

## 2022-04-12 DIAGNOSIS — R918 Other nonspecific abnormal finding of lung field: Secondary | ICD-10-CM | POA: Diagnosis not present

## 2022-04-12 DIAGNOSIS — I2510 Atherosclerotic heart disease of native coronary artery without angina pectoris: Secondary | ICD-10-CM | POA: Diagnosis not present

## 2022-04-12 DIAGNOSIS — R0989 Other specified symptoms and signs involving the circulatory and respiratory systems: Secondary | ICD-10-CM | POA: Diagnosis not present

## 2022-04-13 DIAGNOSIS — S0993XA Unspecified injury of face, initial encounter: Secondary | ICD-10-CM | POA: Diagnosis not present

## 2022-04-17 ENCOUNTER — Telehealth: Payer: Self-pay | Admitting: *Deleted

## 2022-04-17 NOTE — Transitions of Care (Post Inpatient/ED Visit) (Signed)
   04/17/2022  Name: Daryl Cook MRN: [id number] DOB: Aug 27, 1955  Today's TOC FU Call Status: Today's TOC FU Call Status:: Unsuccessul Call (1st Attempt) Unsuccessful Call (1st Attempt) Date: 04/17/22  Attempted to reach the patient regarding the most recent Inpatient/ED visit.  Follow Up Plan: Additional outreach attempts will be made to reach the patient to complete the Transitions of Care (Post Inpatient/ED visit) call.   [facility] [phone]

## 2022-04-18 ENCOUNTER — Telehealth: Payer: Self-pay | Admitting: *Deleted

## 2022-04-18 NOTE — Transitions of Care (Post Inpatient/ED Visit) (Signed)
   04/18/2022  Name: Daryl Cook MRN: [id number] DOB: 21-Jan-1956  Today's TOC FU Call Status: Today's TOC FU Call Status:: Unsuccessful Call (2nd Attempt) Unsuccessful Call (2nd Attempt) Date: 04/18/22  Attempted to reach the patient regarding the most recent Inpatient/ED visit.  Follow Up Plan: Additional outreach attempts will be made to reach the patient to complete the Transitions of Care (Post Inpatient/ED visit) call.   [facility] [phone]

## 2022-04-18 NOTE — Transitions of Care (Post Inpatient/ED Visit) (Signed)
   04/18/2022  Name: Daryl Cook MRN: [id number] DOB: 06-25-1955  Today's TOC FU Call Status: Today's TOC FU Call Status:: Successful TOC FU Call Competed TOC FU Call Complete Date: 04/18/22  Transition Care Management Follow-up Telephone Call Date of Discharge: 04/16/22 Discharge Facility: Other Facility Name of Other Discharge Facility: [facility] Type of Discharge: Inpatient Admission Primary Inpatient Discharge Diagnosis:: MI (left AMA) How have you been since you were released from the hospital?: Better (just feels some anxiety at times) Any questions or concerns?: Yes Patient Questions/Concerns:: I wonder if I am taking 2 much blood thinner. I picked at a scab and it bled Patient Questions/Concerns Addressed: Other: (RN is trying to pt sceduled for follow up visit. Patient is not in [place] until Sunday evening)  Items Reviewed: Did you receive and understand the discharge instructions provided?: No Medications obtained and verified?: No Medications Not Reviewed Reasons:: Other: (patient left AMA) Any new allergies since your discharge?: No Dietary orders reviewed?: No Do you have support at home?: Yes People in Home: friend(s) Name of Support/Comfort Primary Source: [place] and Equipment/Supplies: [place] Ordered?: No Any new equipment or medical supplies ordered?: No  Functional Questionnaire: Do you need assistance with bathing/showering or dressing?: No Do you need assistance with meal preparation?: No Do you need assistance with eating?: No Do you have difficulty maintaining continence: No Do you need assistance with getting out of bed/getting out of a chair/moving?: No Do you have difficulty managing or taking your medications?: No  Folllow up appointments reviewed: PCP Follow-up appointment confirmed?: Yes Date of PCP follow-up appointment?: 04/22/22 Follow-up Provider: Dr Silvio Pate [facility] Follow-up appointment confirmed?:  No Reason Specialist Follow-Up Not Confirmed: Patient has Specialist Provider Number and will Call for Appointment Do you need transportation to your follow-up appointment?: No Do you understand care options if your condition(s) worsen?: Yes-patient verbalized understanding  SDOH Interventions Today    Flowsheet Row Most Recent Value  SDOH Interventions   Food Insecurity Interventions Intervention Not Indicated  Transportation Interventions Intervention Not Indicated       [place] [facility] [phone]

## 2022-04-18 NOTE — Progress Notes (Signed)
  Care Coordination  Note  04/18/2022 Name: TIOFILO SHOW MRN: [id number] DOB: Feb 09, 1956  ELISIAH SCHETTER is a 67 y.o. year old primary care patient of Venia Carbon, MD.   Follow up plan: Hospital Follow Up appointment scheduled with (Dr Silvio Pate) on (04/22/2022) at (1130am).  Julian Hy, [place] Direct Dial: [phone]

## 2022-04-22 ENCOUNTER — Encounter: Payer: Self-pay | Admitting: Internal Medicine

## 2022-04-22 ENCOUNTER — Ambulatory Visit (INDEPENDENT_AMBULATORY_CARE_PROVIDER_SITE_OTHER): Payer: Medicare HMO | Admitting: Internal Medicine

## 2022-04-22 VITALS — BP 118/68 | HR 78 | Temp 97.8°F | Ht 67.0 in | Wt 154.4 lb

## 2022-04-22 DIAGNOSIS — I480 Paroxysmal atrial fibrillation: Secondary | ICD-10-CM

## 2022-04-22 DIAGNOSIS — I2510 Atherosclerotic heart disease of native coronary artery without angina pectoris: Secondary | ICD-10-CM | POA: Diagnosis not present

## 2022-04-22 DIAGNOSIS — I4891 Unspecified atrial fibrillation: Secondary | ICD-10-CM | POA: Insufficient documentation

## 2022-04-22 DIAGNOSIS — I5021 Acute systolic (congestive) heart failure: Secondary | ICD-10-CM | POA: Diagnosis not present

## 2022-04-22 LAB — TSH: TSH: 3.89 u[IU]/mL (ref 0.35–5.50)

## 2022-04-22 LAB — COMPREHENSIVE METABOLIC PANEL
ALT: 16 U/L (ref 0–53)
AST: 18 U/L (ref 0–37)
Albumin: 3.8 g/dL (ref 3.5–5.2)
Alkaline Phosphatase: 58 U/L (ref 39–117)
BUN: 12 mg/dL (ref 6–23)
CO2: 30 mEq/L (ref 19–32)
Calcium: 9.7 mg/dL (ref 8.4–10.5)
Chloride: 105 mEq/L (ref 96–112)
Creatinine, Ser: 1.08 mg/dL (ref 0.40–1.50)
GFR: 71.09 mL/min (ref >60.00)
Glucose, Bld: 99 mg/dL (ref 70–99)
Potassium: 4.5 mEq/L (ref 3.5–5.1)
Sodium: 142 mEq/L (ref 135–145)
Total Bilirubin: 0.7 mg/dL (ref 0.2–1.2)
Total Protein: 6.8 g/dL (ref 6.0–8.3)

## 2022-04-22 LAB — LIPID PANEL
Cholesterol: 103 mg/dL (ref 0–200)
HDL: 49.3 mg/dL (ref >39.00)
LDL Cholesterol: 41 mg/dL (ref 0–99)
NonHDL: 53.41
Total CHOL/HDL Ratio: 2
Triglycerides: 64 mg/dL (ref 0.0–149.0)
VLDL: 12.8 mg/dL (ref 0.0–40.0)

## 2022-04-22 LAB — CBC
HCT: 43.5 % (ref 39.0–52.0)
Hemoglobin: 14.6 g/dL (ref 13.0–17.0)
MCHC: 33.6 g/dL (ref 30.0–36.0)
MCV: 93.4 fl (ref 78.0–100.0)
Platelets: 386 10*3/uL (ref 150.0–400.0)
RBC: 4.66 Mil/uL (ref 4.22–5.81)
RDW: 13.5 % (ref 11.5–15.5)
WBC: 5.9 10*3/uL (ref 4.0–10.5)

## 2022-04-22 NOTE — Assessment & Plan Note (Signed)
Apparently had brief spell Now on metoprolol 25 Eliquis 5 bid for now

## 2022-04-22 NOTE — Assessment & Plan Note (Addendum)
Recent MI ---RCA occlusion ----stented  Records from [facility] hospital reviewed Now on ASA/brilinta Atorvastatin 80 daily---despite past liver problems Will set up with cardiology ASAP Will check labs Expect referral for cardiac rehab as well He plans trip to beach tomorrow---I am not excited about that, but haven't forbidden it
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
## 2022-04-22 NOTE — Assessment & Plan Note (Signed)
EF post MI was 20% He did have brief orthopnea--but no symptoms now Discussed avoiding salt--and weighing daily  On entresto 24/26 bid for now Hopefully will recover myocardial function (likely a stun phenomenon)  Will set up ASAP with cardiology here

## 2022-04-23 ENCOUNTER — Other Ambulatory Visit: Payer: Self-pay | Admitting: Internal Medicine

## 2022-04-24 ENCOUNTER — Telehealth: Payer: Self-pay | Admitting: *Deleted

## 2022-04-24 NOTE — Telephone Encounter (Addendum)
-----   Message from Wellington Hampshire, MD sent at 04/24/2022 11:48 AM EST ----- Lattie Haw, Please add this patient to my schedule within 2 weeks.  Thanks.

## 2022-04-24 NOTE — Telephone Encounter (Signed)
Pt is returning call and is requesting a return call.

## 2022-04-24 NOTE — Telephone Encounter (Signed)
Left a message for the patient to call back to bring him in sooner for an appointment.

## 2022-04-24 NOTE — Telephone Encounter (Signed)
Patient moved up to 2/29 with Dr. Fletcher Anon

## 2022-05-01 ENCOUNTER — Ambulatory Visit: Payer: Medicare HMO | Attending: Cardiology | Admitting: Cardiovascular Disease

## 2022-05-01 ENCOUNTER — Encounter: Payer: Self-pay | Admitting: Cardiovascular Disease

## 2022-05-01 VITALS — BP 122/62 | HR 90 | Ht 67.0 in | Wt 158.0 lb

## 2022-05-01 DIAGNOSIS — I5022 Chronic systolic (congestive) heart failure: Secondary | ICD-10-CM | POA: Diagnosis not present

## 2022-05-01 DIAGNOSIS — I480 Paroxysmal atrial fibrillation: Secondary | ICD-10-CM | POA: Diagnosis not present

## 2022-05-01 DIAGNOSIS — E785 Hyperlipidemia, unspecified: Secondary | ICD-10-CM | POA: Diagnosis not present

## 2022-05-01 DIAGNOSIS — I2510 Atherosclerotic heart disease of native coronary artery without angina pectoris: Secondary | ICD-10-CM
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
## 2022-05-01 NOTE — Patient Instructions (Signed)
Medication Instructions:  STOP the Aspirin *If you need a refill on your cardiac medications before your next appointment, please call your pharmacy*   Lab Work: None ordered If you have labs (blood work) drawn today and your tests are completely normal, you will receive your results only by: [place] (if you have MyChart) OR A paper copy in the mail If you have any lab test that is abnormal or we need to change your treatment, we will call you to review the results.   Testing/Procedures: Your physician has requested that you have an echocardiogram. Echocardiography is a painless test that uses sound waves to create images of your heart. It provides your doctor with information about the size and shape of your heart and how well your heart's chambers and valves are working.   You may receive an ultrasound enhancing agent through an IV if needed to better visualize your heart during the echo. This procedure takes approximately one hour.  There are no restrictions for this procedure.  This will take place at [facility] [id number], [place]    Follow-Up: At [facility], you and your health needs are our priority.  As part of our continuing mission to provide you with exceptional heart care, we have created designated Provider Care Teams.  These Care Teams include your primary Cardiologist (physician) and Advanced Practice Providers (APPs -  Physician Assistants and Nurse Practitioners) who all work together to provide you with the care you need, when you need it.  We recommend signing up for the patient portal called "MyChart".  Sign up information is provided on this After Visit Summary.  MyChart is used to connect with patients for Virtual Visits (Telemedicine).  Patients are able to view lab/test results, encounter notes, upcoming appointments, etc.  Non-urgent messages can be sent to your provider as well.   To learn more about what you  can do with MyChart, go to [email or web address].    Your next appointment:   2 month(s)  Provider:   You may see Dr. Fletcher Anon or one of the following Advanced Practice Providers on your designated Care Team:   Murray Hodgkins, NP Christell Faith, PA-C Cadence Kathlen Mody, PA-C Gerrie Nordmann, NP    Other Instructions A referral has been placed to cardiac rehab. They will call you to set this up.

## 2022-05-05 ENCOUNTER — Other Ambulatory Visit: Payer: Self-pay | Admitting: Internal Medicine

## 2022-05-05 MED ORDER — CLONAZEPAM 0.5 MG PO TABS: 0.5000 mg | ORAL_TABLET | Freq: Every evening | ORAL | 0 refills | Status: DC | PRN

## 2022-05-05 NOTE — Telephone Encounter (Signed)
Last filled 04-02-22 #30 Last OV 04-22-22 Next OV 04-06-23 [facility]

## 2022-05-19 ENCOUNTER — Ambulatory Visit: Payer: Medicare HMO | Attending: Cardiovascular Disease

## 2022-05-19 ENCOUNTER — Encounter: Payer: Self-pay | Admitting: Cardiovascular Disease

## 2022-05-19 DIAGNOSIS — I5022 Chronic systolic (congestive) heart failure: Secondary | ICD-10-CM | POA: Diagnosis not present

## 2022-05-19 LAB — ECHOCARDIOGRAM COMPLETE
AR max vel: 3.1 cm2
AV Area VTI: 3.39 cm2
AV Area mean vel: 3.26 cm2
AV Mean grad: 3 mmHg
AV Peak grad: 5.3 mmHg
Ao pk vel: 1.15 m/s
Area-P 1/2: 4.52 cm2
Calc EF: 41.7 %
S' Lateral: 4.1 cm
Single Plane A2C EF: 46.4 %
Single Plane A4C EF: 40.7 %

## 2022-05-27 ENCOUNTER — Other Ambulatory Visit: Payer: Self-pay | Admitting: Internal Medicine

## 2022-06-12 ENCOUNTER — Ambulatory Visit: Payer: Medicare HMO | Admitting: Cardiology

## 2022-06-13 ENCOUNTER — Encounter: Payer: Self-pay | Admitting: Internal Medicine

## 2022-06-13 MED ORDER — BENZOYL PEROXIDE 5 % EX GEL: Freq: Every day | CUTANEOUS | 3 refills | Status: DC | PRN
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
## 2022-07-02 ENCOUNTER — Ambulatory Visit: Payer: Medicare HMO | Attending: Nurse Practitioner | Admitting: Cardiology

## 2022-07-02 ENCOUNTER — Encounter: Payer: Self-pay | Admitting: Nurse Practitioner

## 2022-07-02 VITALS — BP 115/67 | HR 85 | Ht 67.5 in | Wt 156.6 lb

## 2022-07-02 DIAGNOSIS — I5022 Chronic systolic (congestive) heart failure: Secondary | ICD-10-CM

## 2022-07-02 DIAGNOSIS — I2119 ST elevation (STEMI) myocardial infarction involving other coronary artery of inferior wall: Secondary | ICD-10-CM | POA: Diagnosis not present

## 2022-07-02 DIAGNOSIS — E785 Hyperlipidemia, unspecified: Secondary | ICD-10-CM | POA: Diagnosis not present

## 2022-07-02 DIAGNOSIS — I480 Paroxysmal atrial fibrillation: Secondary | ICD-10-CM

## 2022-07-02 DIAGNOSIS — I2510 Atherosclerotic heart disease of native coronary artery without angina pectoris: Secondary | ICD-10-CM | POA: Diagnosis not present

## 2022-07-02 DIAGNOSIS — I5021 Acute systolic (congestive) heart failure: Secondary | ICD-10-CM | POA: Diagnosis not present

## 2022-07-02 MED ORDER — NITROGLYCERIN 0.4 MG SL SUBL: 0.4000 mg | SUBLINGUAL_TABLET | SUBLINGUAL | 0 refills | Status: DC | PRN

## 2022-07-02 MED ORDER — EMPAGLIFLOZIN 10 MG PO TABS: 10.0000 mg | ORAL_TABLET | Freq: Every day | ORAL | 3 refills | Status: DC

## 2022-07-02 NOTE — Assessment & Plan Note (Signed)
Patient found with LVEF <20% in the setting of acute RCA occlusion/STEMI. LVEF noted to have recovered to 35-40% on repeat TTE 05/19/22. Today reports exertional tolerance has nearly returned to pre-heart attack levels. Denies orthopnea, lower extremity edema.  Continue Entresto 24-26mg  BID Continue Metoprolol Succinate 25mg  Add SGLT2, Jardiance 10mg  (will check BMP in 10 days)

## 2022-07-02 NOTE — Patient Instructions (Signed)
Medication Instructions:  START Jardiance 10 mg once daily  Take Nitroglycerin as needed for chest pain: Place one tablet under the tongue as needed every 5 minutes for chest pain. If you have to use three tablets with no relief, please call 911 or go to your closest Emergency Department.   *If you need a refill on your cardiac medications before your next appointment, please call your pharmacy*   Lab Work: Your provider would like for you to return in 2 weeks to have the following labs drawn: BMET.   Please go to the [facility] entrance and check in at the front desk.  You do not need an appointment.  They are open from 7am-6 pm.  You will not need to be fasting.  If you have labs (blood work) drawn today and your tests are completely normal, you will receive your results only by: MyChart Message (if you have MyChart) OR A paper copy in the mail If you have any lab test that is abnormal or we need to change your treatment, we will call you to review the results.   Testing/Procedures: None ordered   Follow-Up: At [facility], you and your health needs are our priority.  As part of our continuing mission to provide you with exceptional heart care, we have created designated Provider Care Teams.  These Care Teams include your primary Cardiologist (physician) and Advanced Practice Providers (APPs -  Physician Assistants and Nurse Practitioners) who all work together to provide you with the care you need, when you need it.  We recommend signing up for the patient portal called "MyChart".  Sign up information is provided on this After Visit Summary.  MyChart is used to connect with patients for Virtual Visits (Telemedicine).  Patients are able to view lab/test results, encounter notes, upcoming appointments, etc.  Non-urgent messages can be sent to your provider as well.   To learn more about what you can do with MyChart, go to [email or web address].    Your next  appointment:   3 month(s)  Provider:   You may see Lorine Bears, MD or one of the following Advanced Practice Providers on your designated Care Team:   Nicolasa Ducking, NP Eula Listen, PA-C Cadence Fransico Blaike, PA-C Charlsie Quest, NP

## 2022-07-02 NOTE — Assessment & Plan Note (Signed)
Continue high intensity statin, Atorvastatin 80mg . LDL goal <70.

## 2022-07-02 NOTE — Assessment & Plan Note (Signed)
Patient with STEMI/RCA occlusion in [place] on 04/12/22. Received DES x1 to RCA. Also noted with 30% proximal LAD stenosis. Patient without dyspnea or anginal symptoms. Feeling nearly back to normal.   Patient advised that he can increase exercise as tolerated by symptoms (stop/decrease with dyspnea or chest pain) Continue Brilinta 90mg  BID Continue Metoprolol Succinate 25mg  QD Will send in sublingual nitroglycerin for emergency use only in setting of severe angina.

## 2022-07-02 NOTE — Assessment & Plan Note (Signed)
Patient with paroxysmal and brief afib following STEMI in February. ECG today with NSR and patient denies symptoms of palpitations, irregular heart beat, rapid HR. Given ongoing reduced LVEF, CHA2DS2-VASc Score = 3, will continue Eliquis 5mg  BID. If patient ultimately has normalization of LVEF, could reconsider long-term use.

## 2022-07-03 ENCOUNTER — Other Ambulatory Visit: Payer: Self-pay

## 2022-07-03 ENCOUNTER — Other Ambulatory Visit: Payer: Self-pay | Admitting: Internal Medicine

## 2022-07-03 MED ORDER — ENTRESTO 24-26 MG PO TABS
1.0000 | ORAL_TABLET | Freq: Two times a day (BID) | ORAL | 3 refills | Status: DC
  Filled 2022-07-03: qty 180, 90d supply, fill #0

## 2022-07-04 ENCOUNTER — Other Ambulatory Visit: Payer: Self-pay

## 2022-07-04 MED ORDER — CLONAZEPAM 0.5 MG PO TABS: 0.5000 mg | ORAL_TABLET | Freq: Every evening | ORAL | 0 refills | Status: DC | PRN

## 2022-07-04 NOTE — Telephone Encounter (Signed)
Last filled 05-05-22 #30 Last OV 04-22-22 Next OV 04-06-23 [facility]

## 2022-07-18 ENCOUNTER — Telehealth: Payer: Self-pay | Admitting: Pharmacist

## 2022-07-18 DIAGNOSIS — E785 Hyperlipidemia, unspecified: Secondary | ICD-10-CM

## 2022-07-18 DIAGNOSIS — I5021 Acute systolic (congestive) heart failure: Secondary | ICD-10-CM

## 2022-07-18 DIAGNOSIS — I2510 Atherosclerotic heart disease of native coronary artery without angina pectoris: Secondary | ICD-10-CM

## 2022-07-18 DIAGNOSIS — Z8619 Personal history of other infectious and parasitic diseases: Secondary | ICD-10-CM

## 2022-07-18 NOTE — Telephone Encounter (Signed)
PharmD reviewed patient chart to assess eligibility for [facility] Care Management and Coordination services. Patient was determined to be a good candidate for the program given the complexity of the medication regimen and/or overall risk for hospitalization and increased utilization.  Referral entered in order to outreach patient and offer appointment with PharmD. Referral cosigned to PCP.  

## 2022-07-21 ENCOUNTER — Encounter: Payer: Self-pay | Admitting: Internal Medicine

## 2022-07-22 ENCOUNTER — Encounter: Payer: Self-pay | Admitting: Gastroenterology

## 2022-07-24 ENCOUNTER — Telehealth: Payer: Self-pay

## 2022-07-24 NOTE — Progress Notes (Signed)
Care Management & Coordination Services Pharmacy Team  Reason for Encounter: Appointment Reminder  Contacted patient to confirm in office appointment with Al Corpus , PharmD on 07/29/22 at 11:00. Unsuccessful outreach. Left voicemail for patient to return call.  Have you seen any other providers since your last visit with PCP? Yes  cardiology  Chart review:  Recent office visits:  04/22/22-Richard Letvak,MD(PCP)-hospital f/u,referral ASAP for cardiology, future labs ordered.(Cholesterol good,everything normal) 04/02/22-Richard Letvak,MD(PCP)- AWV, discussed vaccines, screenings,flu shot given.  Recent consult visits:  07/02/22-Evan Williams,PA(cardio)-f/u CAD, send in sublingual nitro for severe angina,future labs,EKG,increase exercise as tolerated,f/u 3 months 05/19/22- Cardiology- testing  05/01/22-Muhammad Arida,MD(cardio)-CAD/Afib,stop aspirin, cardiac rehab referral,f/u 2 months  Hospital visits:  04/12/22 thru [facility]- STEMI, discharged home - started on toprol,entresto,eliquis,Patient left AMA   Star Rating Drugs:  Medication:  Last Fill: [facility] Atorvastatin 80mg  07/10/22  90 Entresto 24-26mg  07/07/22  90 Jardiance 10mg  07/04/22  30   Care Gaps: Annual wellness visit in last year? Yes  Al Corpus, PharmD notified  Burt Knack, [facility] Clinical Pharmacy Assistant [phone]

## 2022-07-29 ENCOUNTER — Encounter: Payer: Medicare HMO | Admitting: Pharmacist

## 2022-07-31 ENCOUNTER — Ambulatory Visit: Payer: Medicare HMO | Admitting: Pharmacist
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
## 2022-08-07 ENCOUNTER — Telehealth: Payer: Self-pay

## 2022-08-07 NOTE — Telephone Encounter (Signed)
-----   Message from Kathyrn Sheriff, [facility] sent at 08/05/2022  4:21 PM EDT ----- Regarding: call patient I advised pt to go to [facility] for labwork ordered by cardiology, to monitor Jardiance. Make sure he has done this/is planning to do this.   Please go to the [facility] entrance and check in at the front desk.   You do not need an appointment.   They are open from 7am-6 pm.   You will not need to be fasting.

## 2022-08-07 NOTE — Progress Notes (Signed)
Care Management & Coordination Services Pharmacy Team  Reason for Encounter: Labwork    Unsuccessful outreach. Left voicemail for patient to return call.  Details for getting cardiology lab work done left for the patient    Al Corpus, PharmD notified  Burt Knack, [facility] Clinical Pharmacy Assistant [phone]

## 2022-08-11 ENCOUNTER — Other Ambulatory Visit: Payer: Self-pay | Admitting: Internal Medicine

## 2022-08-12 MED ORDER — CLONAZEPAM 0.5 MG PO TABS: 0.5000 mg | ORAL_TABLET | Freq: Every evening | ORAL | 0 refills | Status: DC | PRN

## 2022-08-12 NOTE — Telephone Encounter (Signed)
Last filled 07-04-22 #30 Last OV 04-22-22 Next OV 04-06-23 [facility]

## 2022-09-10 ENCOUNTER — Other Ambulatory Visit: Payer: Self-pay | Admitting: Internal Medicine

## 2022-09-24 ENCOUNTER — Other Ambulatory Visit: Payer: Self-pay | Admitting: Internal Medicine

## 2022-09-25 MED ORDER — CLONAZEPAM 0.5 MG PO TABS: 0.5000 mg | ORAL_TABLET | Freq: Every evening | ORAL | 0 refills | Status: DC | PRN

## 2022-10-02 ENCOUNTER — Ambulatory Visit: Payer: Medicare HMO | Attending: Cardiovascular Disease | Admitting: Cardiovascular Disease
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
## 2022-10-03 ENCOUNTER — Encounter: Payer: Self-pay | Admitting: Cardiovascular Disease

## 2022-11-05 ENCOUNTER — Encounter: Payer: Medicare HMO | Admitting: Pharmacist
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
## 2022-11-17 ENCOUNTER — Other Ambulatory Visit: Payer: Self-pay | Admitting: Internal Medicine

## 2022-11-18 MED ORDER — CLONAZEPAM 0.5 MG PO TABS: 0.5000 mg | ORAL_TABLET | Freq: Every evening | ORAL | 0 refills | Status: DC | PRN

## 2022-11-18 NOTE — Telephone Encounter (Signed)
Last filled 09-25-22 #30 Last OV 04-22-22 Next OV 04-06-23 [facility]

## 2022-11-19 ENCOUNTER — Ambulatory Visit: Payer: Medicare HMO | Attending: Physician Assistant | Admitting: Physician Assistant

## 2022-11-19 ENCOUNTER — Encounter: Payer: Self-pay | Admitting: Physician Assistant

## 2022-11-19 VITALS — BP 135/79 | HR 89 | Ht 67.5 in | Wt 158.4 lb

## 2022-11-19 DIAGNOSIS — I5020 Unspecified systolic (congestive) heart failure: Secondary | ICD-10-CM

## 2022-11-19 DIAGNOSIS — E785 Hyperlipidemia, unspecified: Secondary | ICD-10-CM

## 2022-11-19 DIAGNOSIS — I480 Paroxysmal atrial fibrillation: Secondary | ICD-10-CM | POA: Diagnosis not present

## 2022-11-19 DIAGNOSIS — I255 Ischemic cardiomyopathy: Secondary | ICD-10-CM

## 2022-11-19 DIAGNOSIS — Z79899 Other long term (current) drug therapy: Secondary | ICD-10-CM

## 2022-11-19 DIAGNOSIS — I2510 Atherosclerotic heart disease of native coronary artery without angina pectoris: Secondary | ICD-10-CM | POA: Diagnosis not present

## 2022-11-19 LAB — CBC
Hematocrit: 38.6 % (ref 37.5–51.0)
Hemoglobin: 12.5 g/dL — ABNORMAL LOW (ref 13.0–17.7)
MCH: 29.1 pg (ref 26.6–33.0)
MCHC: 32.4 g/dL (ref 31.5–35.7)
MCV: 90 fL (ref 79–97)
Platelets: 290 10*3/uL (ref 150–450)
RBC: 4.3 x10E6/uL (ref 4.14–5.80)
RDW: 13.9 % (ref 11.6–15.4)
WBC: 3.8 10*3/uL (ref 3.4–10.8)

## 2022-11-19 LAB — BASIC METABOLIC PANEL WITH GFR
BUN/Creatinine Ratio: 8 — ABNORMAL LOW (ref 10–24)
BUN: 8 mg/dL (ref 8–27)
CO2: 26 mmol/L (ref 20–29)
Calcium: 9.2 mg/dL (ref 8.6–10.2)
Chloride: 104 mmol/L (ref 96–106)
Creatinine, Ser: 0.98 mg/dL (ref 0.76–1.27)
Glucose: 98 mg/dL (ref 70–99)
Potassium: 4.1 mmol/L (ref 3.5–5.2)
Sodium: 142 mmol/L (ref 134–144)
eGFR: 85 mL/min/{1.73_m2} (ref >59)

## 2022-11-19 MED ORDER — ENTRESTO 24-26 MG PO TABS: 1.0000 | ORAL_TABLET | Freq: Two times a day (BID) | ORAL | 3 refills | Status: DC

## 2022-11-19 MED ORDER — APIXABAN 5 MG PO TABS: 5.0000 mg | ORAL_TABLET | Freq: Two times a day (BID) | ORAL | 3 refills | Status: DC

## 2022-11-19 MED ORDER — NITROGLYCERIN 0.4 MG SL SUBL: 0.4000 mg | SUBLINGUAL_TABLET | SUBLINGUAL | 0 refills | Status: DC | PRN

## 2022-11-19 MED ORDER — EMPAGLIFLOZIN 10 MG PO TABS: 10.0000 mg | ORAL_TABLET | Freq: Every day | ORAL | 3 refills | Status: DC

## 2022-11-19 MED ORDER — ATORVASTATIN CALCIUM 80 MG PO TABS: 80.0000 mg | ORAL_TABLET | Freq: Every day | ORAL | 3 refills | Status: DC

## 2022-11-19 MED ORDER — TICAGRELOR 90 MG PO TABS: 90.0000 mg | ORAL_TABLET | Freq: Two times a day (BID) | ORAL | 3 refills | Status: DC

## 2022-11-19 MED ORDER — METOPROLOL SUCCINATE ER 25 MG PO TB24: 25.0000 mg | ORAL_TABLET | Freq: Every day | ORAL | 3 refills | Status: DC

## 2022-11-19 NOTE — Patient Instructions (Signed)
Medication Instructions:  Your Physician recommend you continue on your current medication as directed.    *If you need a refill on your cardiac medications before your next appointment, please call your pharmacy*   Lab Work: Your provider would like for you to have following labs drawn today CBC and BMT.   If you have labs (blood work) drawn today and your tests are completely normal, you will receive your results only by: MyChart Message (if you have MyChart) OR A paper copy in the mail If you have any lab test that is abnormal or we need to change your treatment, we will call you to review the results.   Follow-Up: At [facility], you and your health needs are our priority.  As part of our continuing mission to provide you with exceptional heart care, we have created designated Provider Care Teams.  These Care Teams include your primary Cardiologist (physician) and Advanced Practice Providers (APPs -  Physician Assistants and Nurse Practitioners) who all work together to provide you with the care you need, when you need it.  We recommend signing up for the patient portal called "MyChart".  Sign up information is provided on this After Visit Summary.  MyChart is used to connect with patients for Virtual Visits (Telemedicine).  Patients are able to view lab/test results, encounter notes, upcoming appointments, etc.  Non-urgent messages can be sent to your provider as well.   To learn more about what you can do with MyChart, go to [email or web address].    Your next appointment:   3 month(s)  Provider:   You may see Lorine Bears, MD or one of the following Advanced Practice Providers on your designated Care Team:   Eula Listen, [place]

## 2022-11-21 ENCOUNTER — Other Ambulatory Visit: Payer: Self-pay | Admitting: Emergency Medicine

## 2022-11-21 DIAGNOSIS — Z79899 Other long term (current) drug therapy: Secondary | ICD-10-CM

## 2022-11-27 ENCOUNTER — Other Ambulatory Visit: Payer: Self-pay

## 2022-11-27 DIAGNOSIS — Z79899 Other long term (current) drug therapy: Secondary | ICD-10-CM

## 2022-11-27 LAB — CBC
Hematocrit: 38.2 % (ref 37.5–51.0)
Hemoglobin: 12.5 g/dL — ABNORMAL LOW (ref 13.0–17.7)
MCH: 29 pg (ref 26.6–33.0)
MCHC: 32.7 g/dL (ref 31.5–35.7)
MCV: 89 fL (ref 79–97)
Platelets: 352 10*3/uL (ref 150–450)
RBC: 4.31 x10E6/uL (ref 4.14–5.80)
RDW: 14.1 % (ref 11.6–15.4)
WBC: 4.8 10*3/uL (ref 3.4–10.8)

## 2022-12-13 NOTE — Telephone Encounter (Signed)
Daryl Cook,  This patient had 2 small colon polyps in 2019 and was on for 5-year recall.  Primary care has discovered a mild normocytic anemia and wants [place] to get him scheduled for colonoscopy out of concern for GI blood loss as a potential contributing factor to that anemia.  However, a brief chart review notes this patient saw cardiology within the last 2 months because he had an MI requiring coronary stent placement in February of this year and developed CHF that has modestly improved with medical management since then.  His cardiologist is making adjustments to medicines and considering repeat echocardiogram in a few months.  He could not be off antiplatelet agent for 12 months since the stent placement.  So it is okay that my next available office appointment is probably in December or January. Please contact the patient and make those arrangements to see me.  Dr. Alphonsus Sias,  Thanks for getting in touch with [place].  Please see the note above to my nurse.  He had 2 diminutive colon polyps in 2019 and follow-up guidelines are for surveillance exam in 5 to 7 years.  He will have to wait for colonoscopy until he is at least 12 months out from his drug-eluting stent placement, and also hopefully with improvement in his LV ejection fraction.  It is also concerning the cardiology notes he still intermittently uses cocaine.  GI blood loss is not the most likely explanation for his normocytic hemoglobin of 12.5.  Please consider some further workup with iron studies, B12 and folate since he does not appear to have any of those on file.  Thanks very much  Amada Jupiter, Adult nurse GI

## 2022-12-16 NOTE — Telephone Encounter (Signed)
Left message for pt to call back  °

## 2022-12-16 NOTE — Telephone Encounter (Signed)
Patient and patient's PCP are aware of recommendations for further evaluation of intermittent mild anemia.

## 2022-12-17 NOTE — Telephone Encounter (Signed)
Left message for pt to call back  °

## 2022-12-18 ENCOUNTER — Telehealth: Payer: Self-pay

## 2022-12-18 NOTE — Telephone Encounter (Signed)
Left message for pt to call back  °

## 2022-12-21 ENCOUNTER — Other Ambulatory Visit: Payer: Self-pay | Admitting: Internal Medicine

## 2022-12-22 MED ORDER — CLONAZEPAM 0.5 MG PO TABS: 0.5000 mg | ORAL_TABLET | Freq: Every evening | ORAL | 0 refills | Status: DC | PRN

## 2022-12-22 NOTE — Telephone Encounter (Signed)
Last filled 11-18-22 #30 Last OV 04-22-22 Next OV 04-06-23 [facility]

## 2023-01-02 ENCOUNTER — Encounter: Payer: Self-pay | Admitting: Physician Assistant

## 2023-01-29 ENCOUNTER — Emergency Department: Payer: Medicare HMO

## 2023-01-29 ENCOUNTER — Emergency Department
Admission: EM | Admit: 2023-01-29 | Discharge: 2023-01-29 | Disposition: A | Discharge status: Home / Self Care | Payer: Medicare HMO | Attending: Emergency Medicine | Admitting: Emergency Medicine

## 2023-01-29 ENCOUNTER — Other Ambulatory Visit: Payer: Self-pay

## 2023-01-29 DIAGNOSIS — F10929 Alcohol use, unspecified with intoxication, unspecified: Secondary | ICD-10-CM

## 2023-01-29 DIAGNOSIS — R55 Syncope and collapse: Secondary | ICD-10-CM | POA: Diagnosis not present

## 2023-01-29 DIAGNOSIS — Y904 Blood alcohol level of 80-99 mg/100 ml: Secondary | ICD-10-CM | POA: Diagnosis not present

## 2023-01-29 DIAGNOSIS — F10129 Alcohol abuse with intoxication, unspecified: Secondary | ICD-10-CM | POA: Diagnosis not present

## 2023-01-29 DIAGNOSIS — S0990XA Unspecified injury of head, initial encounter: Secondary | ICD-10-CM | POA: Diagnosis not present

## 2023-01-29 DIAGNOSIS — I959 Hypotension, unspecified: Secondary | ICD-10-CM | POA: Diagnosis not present

## 2023-01-29 LAB — BASIC METABOLIC PANEL
Anion gap: 11 (ref 5–15)
BUN: 14 mg/dL (ref 8–23)
CO2: 21 mmol/L — ABNORMAL LOW (ref 22–32)
Calcium: 7.8 mg/dL — ABNORMAL LOW (ref 8.9–10.3)
Chloride: 107 mmol/L (ref 98–111)
Creatinine, Ser: 0.89 mg/dL (ref 0.61–1.24)
GFR, Estimated: >60 mL/min (ref >60)
Glucose, Bld: 83 mg/dL (ref 70–99)
Potassium: 3.2 mmol/L — ABNORMAL LOW (ref 3.5–5.1)
Sodium: 139 mmol/L (ref 135–145)

## 2023-01-29 LAB — CBC WITH DIFFERENTIAL/PLATELET
Abs Immature Granulocytes: 0.03 10*3/uL (ref 0.00–0.07)
Basophils Absolute: 0.1 10*3/uL (ref 0.0–0.1)
Basophils Relative: 1 %
Eosinophils Absolute: 0.1 10*3/uL (ref 0.0–0.5)
Eosinophils Relative: 2 %
HCT: 31.5 % — ABNORMAL LOW (ref 39.0–52.0)
Hemoglobin: 9.9 g/dL — ABNORMAL LOW (ref 13.0–17.0)
Immature Granulocytes: 1 %
Lymphocytes Relative: 38 %
Lymphs Abs: 2.1 10*3/uL (ref 0.7–4.0)
MCH: 27.7 pg (ref 26.0–34.0)
MCHC: 31.4 g/dL (ref 30.0–36.0)
MCV: 88 fL (ref 80.0–100.0)
Monocytes Absolute: 0.5 10*3/uL (ref 0.1–1.0)
Monocytes Relative: 9 %
Neutro Abs: 2.9 10*3/uL (ref 1.7–7.7)
Neutrophils Relative %: 49 %
Platelets: 226 10*3/uL (ref 150–400)
RBC: 3.58 MIL/uL — ABNORMAL LOW (ref 4.22–5.81)
RDW: 14.8 % (ref 11.5–15.5)
WBC: 5.6 10*3/uL (ref 4.0–10.5)
nRBC: 0 % (ref 0.0–0.2)

## 2023-01-29 LAB — ETHANOL: Alcohol, Ethyl (B): 97 mg/dL — ABNORMAL HIGH (ref <10)

## 2023-01-29 LAB — TROPONIN I (HIGH SENSITIVITY): Troponin I (High Sensitivity): 13 ng/L (ref <18)

## 2023-01-29 MED ORDER — POTASSIUM CHLORIDE CRYS ER 20 MEQ PO TBCR
40.0000 meq | EXTENDED_RELEASE_TABLET | Freq: Once | ORAL | Status: AC
  Administered 2023-01-29: 40 meq via ORAL
  Filled 2023-01-29: qty 2
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
## 2023-01-29 NOTE — ED Provider Notes (Signed)
[facility] Provider Note    Event Date/Time   First MD Initiated Contact with Patient 01/29/23 0147     (approximate)   History   Loss of Consciousness   HPI Daryl Cook is a 67 y.o. male who presents by EMS after passing out.  He reports that he was "partying hard" with a bunch of friends.  He admits to drinking liquor, beer, wine, using marijuana, and using cocaine.  He said at some point he got very dizzy and then passed out.  He remembers waking up on the floor.  He has no pain currently.  He had some nausea earlier but no longer.  He said he feels cold.  He felt fine before this episode.  He has no chest pain or shortness of breath.     Physical Exam   Triage Vital Signs: ED Triage Vitals  Encounter Vitals Group     BP 01/29/23 0144 (!) 106/55     Systolic BP Percentile --      Diastolic BP Percentile --      Pulse Rate 01/29/23 0144 73     Resp 01/29/23 0144 11     Temp 01/29/23 0151 (!) 94.9 F (34.9 C)     Temp Source 01/29/23 0151 Rectal     SpO2 01/29/23 0144 94 %     Weight 01/29/23 0143 68 kg (150 lb)     Height 01/29/23 0143 1.702 m (5\' 7" )     Head Circumference --      Peak Flow --      Pain Score 01/29/23 0142 0     Pain Loc --      Pain Education --      Exclude from Growth Chart --     Most recent vital signs: Vitals:   01/29/23 0630 01/29/23 0700  BP: 126/69 132/72  Pulse: 84 84  Resp: 15 13  Temp:    SpO2: 95% 95%    General: Awake, no distress.  Awake and alert despite multiple intoxicants. CV:  Good peripheral perfusion.  Regular rate and rhythm.  Normal heart sounds. Resp:  Normal effort. Speaking easily and comfortably, no accessory muscle usage nor intercostal retractions.  Lungs clear to auscultation. Abd:  No distention.  No tenderness to palpation of the abdomen. Other:  Denies SI.  No medical complaints at this time other than feeling cold.   ED Results / Procedures / Treatments   Labs (all labs  ordered are listed, but only abnormal results are displayed) Labs Reviewed  ETHANOL - Abnormal; Notable for the following components:      Result Value   Alcohol, Ethyl (B) 97 (*)    All other components within normal limits  BASIC METABOLIC PANEL - Abnormal; Notable for the following components:   Potassium 3.2 (*)    CO2 21 (*)    Calcium 7.8 (*)    All other components within normal limits  CBC WITH DIFFERENTIAL/PLATELET - Abnormal; Notable for the following components:   RBC 3.58 (*)    Hemoglobin 9.9 (*)    HCT 31.5 (*)    All other components within normal limits  TROPONIN I (HIGH SENSITIVITY)     EKG  ED ECG REPORT I, Loleta Rose, the attending physician, personally viewed and interpreted this ECG.  Date: 01/29/2023 EKG Time: 1:48 AM Rate: 71 Rhythm: normal sinus rhythm QRS Axis: normal Intervals: Prolonged QTc at 519 ms, otherwise unremarkable ST/T Wave abnormalities: normal Narrative Interpretation:  no evidence of acute ischemia    RADIOLOGY I viewed and interpreted the patient's CT head and CT cervical spine.  No indication of intracranial bleed or fracture/dislocation.   PROCEDURES:  Critical Care performed: No  .1-3 Lead EKG Interpretation  Performed by: Loleta Rose, MD Authorized by: Loleta Rose, MD     Interpretation: normal     ECG rate:  75   ECG rate assessment: normal     Rhythm: sinus rhythm     Ectopy: none     Conduction: normal       IMPRESSION / MDM / ASSESSMENT AND PLAN / ED COURSE  I reviewed the triage vital signs and the nursing notes.                              Differential diagnosis includes, but is not limited to, nonspecific syncope, intoxication from multiple substances, cardiogenic syncope or arrhythmia, electrolyte or metabolic abnormality, sepsis.  Patient's presentation is most consistent with acute presentation with potential threat to life or bodily function.  Labs/studies ordered: EKG, high-sensitivity  troponin, ethanol level (needed to verify the patient's reported alcohol consumption), BMP, CBC with differential, head CT, cervical spine CT  Interventions/Medications given:  Medications  potassium chloride SA (KLOR-CON M) CR tablet 40 mEq (40 mEq Oral Given 01/29/23 0304)    (Note:  hospital course my include additional interventions and/or labs/studies not listed above.)   Patient with no medical complaints or concerns at this time.  He admits to drinking extensively and using marijuana and cocaine.  I suspect this is the reason for his passing out.  His hypothermia is unusual with a rectal temperature of 94.9 but we will work on passive warming.  The patient is encouraged to eat and drink.  No indication for further intervention other than the imaging which is also pending given that he apparently fell but he has no sign of trauma.  I anticipate discharge if his workup is reassuring and if his temperature normalizes.  Ordering potassium 40 mill equivalents by mouth for his mild hypokalemia, labs otherwise unremarkable.  I will obtain CT of the head and C-spine given that the patient may have fallen while intoxicated and reportedly is on Eliquis.  The patient is on the cardiac monitor to evaluate for evidence of arrhythmia and/or significant heart rate changes.   Clinical Course as of 01/29/23 0738  Thu Jan 29, 2023  8119 Patient is awake, alert, appears clinically sober.  He said he feels fine.  Vital signs are stable.  He says he is ready to go and I see no evidence of an acute or emergent medical condition given that his labs and imaging is normal.  I gave the patient my usual and customary follow-up recommendations and return precautions. [CF]    Clinical Course User Index [CF] Loleta Rose, MD     FINAL CLINICAL IMPRESSION(S) / ED DIAGNOSES   Final diagnoses:  Syncope and collapse  Alcoholic intoxication with complication [facility])     Rx / DC Orders   ED Discharge Orders      None        Note:  This document was prepared using Dragon voice recognition software and may include unintentional dictation errors.   Loleta Rose, MD 01/29/23 [phone]

## 2023-01-29 NOTE — ED Notes (Signed)
Multiple warm blankets applied to pt.

## 2023-01-29 NOTE — Discharge Instructions (Signed)
Your workup in the Emergency Department today was reassuring.  We did not find any specific abnormalities.  We recommend you drink plenty of fluids, take your regular medications and/or any new ones prescribed today, and follow up with the doctor(s) listed in these documents as recommended.  Return to the Emergency Department if you develop new or worsening symptoms that concern you.  

## 2023-02-17 ENCOUNTER — Encounter: Payer: Self-pay | Admitting: Physician Assistant

## 2023-02-17 ENCOUNTER — Ambulatory Visit: Payer: Medicare HMO | Attending: Physician Assistant | Admitting: Emergency Medicine

## 2023-02-17 VITALS — BP 112/70 | HR 89 | Ht 67.0 in | Wt 153.8 lb

## 2023-02-17 DIAGNOSIS — Z79899 Other long term (current) drug therapy: Secondary | ICD-10-CM | POA: Diagnosis not present

## 2023-02-17 DIAGNOSIS — I5021 Acute systolic (congestive) heart failure: Secondary | ICD-10-CM | POA: Diagnosis not present

## 2023-02-17 DIAGNOSIS — I2510 Atherosclerotic heart disease of native coronary artery without angina pectoris: Secondary | ICD-10-CM

## 2023-02-17 DIAGNOSIS — I255 Ischemic cardiomyopathy: Secondary | ICD-10-CM | POA: Diagnosis not present

## 2023-02-17 DIAGNOSIS — I480 Paroxysmal atrial fibrillation: Secondary | ICD-10-CM

## 2023-02-17 DIAGNOSIS — F1410 Cocaine abuse, uncomplicated: Secondary | ICD-10-CM | POA: Diagnosis not present

## 2023-02-17 DIAGNOSIS — I2119 ST elevation (STEMI) myocardial infarction involving other coronary artery of inferior wall: Secondary | ICD-10-CM | POA: Diagnosis not present

## 2023-02-17 DIAGNOSIS — R55 Syncope and collapse: Secondary | ICD-10-CM

## 2023-02-17 DIAGNOSIS — E785 Hyperlipidemia, unspecified: Secondary | ICD-10-CM | POA: Diagnosis not present

## 2023-02-17 DIAGNOSIS — I5022 Chronic systolic (congestive) heart failure: Secondary | ICD-10-CM | POA: Diagnosis not present

## 2023-02-17 DIAGNOSIS — I5020 Unspecified systolic (congestive) heart failure: Secondary | ICD-10-CM

## 2023-02-17 NOTE — Patient Instructions (Addendum)
Medication Instructions:  No changes at this time.   *If you need a refill on your cardiac medications before your next appointment, please call your pharmacy*   Lab Work: CBC & CMP today  If you have labs (blood work) drawn today and your tests are completely normal, you will receive your results only by: MyChart Message (if you have MyChart) OR A paper copy in the mail If you have any lab test that is abnormal or we need to change your treatment, we will call you to review the results.   Testing/Procedures: None   Follow-Up: At [facility], you and your health needs are our priority.  As part of our continuing mission to provide you with exceptional heart care, we have created designated Provider Care Teams.  These Care Teams include your primary Cardiologist (physician) and Advanced Practice Providers (APPs -  Physician Assistants and Nurse Practitioners) who all work together to provide you with the care you need, when you need it.   Your next appointment:   2 month(s)  Provider:   Lorine Bears, MD or Eula Listen, PA-C

## 2023-02-17 NOTE — Progress Notes (Addendum)
Cardiology Office Note:    Date:  02/17/2023  ID:  ALFONZA BRIESE, DOB 05-21-1955, MRN [id number] PCP: Karie Schwalbe, MD  [facility] Providers Cardiologist:  Lorine Bears, MD       Patient Profile:      Jaguar Safko is a 67 year old male with visit pertinent history of CAD with inferior posterior ST elevation MI in the setting of cocaine use status post PCI/DES to the RCA complicated by post MI A-fib, HFrEF secondary to ICM, hepatitis C, HLD, prior tobacco use.  In February 2024 while in [place] he presented with an inferior posterior ST elevation MI in the setting of cocaine use.  LHC showed an occluded RCA which was treated with PCI/DES.  The LAD had 30% proximal stenosis and the LCx was normal.  He did have hypotension and bradycardia that was treated with epinephrine and dopamine.  Echocardiogram showed an EF of less than 20%.  He did have A-fib post MI and was started on Eliquis.  He did leave AMA.  He established with Dr. Kirke Corin in February 2024 and reported that he did not use cocaine on a regular basis.  At that time aspirin was discontinued given he was on apixaban and Ticagrelor.  Echocardiogram in March 2024 showed an EF of 35-40%, global hypokinesis, grade 1 DD, RV SF normal, mild dilation of the ascending aorta measuring 39 mm.  He was last seen by Eula Listen, PA on September 2024 where he noted that historically he has been noncompliant with his medication regimen.  Further GDMT not escalated at this time given he was not taking his medications appropriately.  It was noted that he does continue to occasionally use cocaine.  On 01/29/2023 was seen in the ED for syncope and collapse.  He admitted to drinking liquor, beer, and using marijuana and cocaine.  His rectal temperature was 94.9.  His potassium was 3.2, ethanol 97, and hemoglobin 9.9.  EKG showing sinus rhythm with no evidence of acute ischemia.      History of Present Illness:  BROOKES PICK is a 67  y.o. male who returns for routine 4-month follow-up and ED follow-up for syncope  Today patient notes he is doing okay overall.  He notes over the past 3 months he has been noncompliant with his medication regimen.  He notes he does not have a routine sleep schedule.  Some days he wakes up in the middle of the day and some days he goes to bed in the middle of the day so his days are very inconsistent timewise.  He notes he typically does not remember to take his medications and is not the fact that he does not want to take them.  He will usually get around 3 to 4 days without taking his medications at a time.  With regards to his recent ED visit on 01/29/2023 he noted he took shots of Ellison Carwin and used cocaine.  He notes he just remembers "blacking out" and being very cold.  He notes that with his history of syncope that he has only lost consciousness after partaking in cocaine.  He has never had a syncopal episode while not doing cocaine the day of.  He notes he typically does cocaine on the weekends with friends and is typically hard to stay away due to being around the drug so often.  He does not use cocaine during the week and is typically working outside as he is currently retired.  He  would like to stop using cocaine as he is worried about his heart health.  His only complaint today is his ongoing baseline SOB.  He notes that his shortness of breath has not worsened and most days he typically feels fine but sometimes he will get short of breath doing minimal activity such as moving wood.  We need to speak regarding his most recent hemoglobin level, he does note that he has a colonoscopy scheduled for January 2025.  He denies chest pain, shortness of breath, lower extremity edema, fatigue, palpitations, melena, hematuria, hemoptysis, diaphoresis, weakness, presyncope, syncope, orthopnea, and PND.       Review of Systems  Constitutional: Negative for weight gain and weight loss.  Cardiovascular:   Negative for chest pain, claudication, dyspnea on exertion, irregular heartbeat, leg swelling, near-syncope, orthopnea, palpitations, paroxysmal nocturnal dyspnea and syncope.  Respiratory:  Positive for shortness of breath. Negative for cough and hemoptysis.   Gastrointestinal:  Negative for abdominal pain, hematochezia and melena.  Genitourinary:  Negative for hematuria.  Neurological:  Negative for dizziness and light-headedness.     See HPI     Studies Reviewed:       Echocardiogram 05/19/2022 1. Left ventricular ejection fraction, by estimation, is 35 to 40%. The  left ventricle has moderately decreased function. The left ventricle  demonstrates global hypokinesis. Left ventricular diastolic parameters are  consistent with Grade I diastolic  dysfunction (impaired relaxation). The average left ventricular global  longitudinal strain is -13.4 %. The global longitudinal strain is  abnormal.   2. Right ventricular systolic function is low normal. The right  ventricular size is normal.   3. The mitral valve is normal in structure. Mild mitral valve  regurgitation.   4. The aortic valve is tricuspid. Aortic valve regurgitation is not  visualized.   5. Aortic dilatation noted. There is mild dilatation of the ascending  aorta, measuring 39 mm.   6. The inferior vena cava is normal in size with greater than 50%  respiratory variability, suggesting right atrial pressure of 3 mmHg.  Risk Assessment/Calculations:    CHA2DS2-VASc Score = 3   This indicates a 3.2% annual risk of stroke. The patient's score is based upon: CHF History: 1 HTN History: 0 Diabetes History: 0 Stroke History: 0 Vascular Disease History: 1 Age Score: 1 Gender Score: 0            Physical Exam:   VS:  BP 112/70 (BP Location: Left Arm, Patient Position: Sitting, Cuff Size: Normal)   Pulse 89   Ht 5\' 7"  (1.702 m)   Wt 153 lb 12.8 oz (69.8 kg)   SpO2 98%   BMI 24.09 kg/m    Wt Readings from Last 3  Encounters:  02/17/23 153 lb 12.8 oz (69.8 kg)  01/29/23 150 lb (68 kg)  11/19/22 158 lb 6.4 oz (71.8 kg)    Constitutional:      Appearance: Normal and healthy appearance.  Neck:     Vascular: JVD normal.  Pulmonary:     Effort: Pulmonary effort is normal.     Breath sounds: Normal breath sounds.  Chest:     Chest wall: Not tender to palpatation.  Cardiovascular:     PMI at left midclavicular line. Normal rate. Regular rhythm. Normal S1. Normal S2.      Murmurs: There is no murmur.     No gallop.  No click. No rub.  Pulses:    Intact distal pulses.  Edema:    Peripheral edema  absent.  Musculoskeletal: Normal range of motion.     Cervical back: Normal range of motion and neck supple. Skin:    General: Skin is warm and dry.  Neurological:     General: No focal deficit present.     Mental Status: Alert and oriented to person, place and time.  Psychiatric:        Behavior: Behavior is cooperative.        Assessment and Plan:  Coronary artery disease -On 04/2022 LHC showed an occluded RCA which was treated with PCI/DES. He had inferior posterior ST elevation MI in the setting of cocaine use. He is stable with no anginal symptoms, no indication for ischemic evaluation at this time.  He is not currently compliant with his medication regimen, we spoke in great detail regarding his increased risk of CVD.  He will work on making a daily schedule to increase his compliance.  Continue Brilinta 90 mg twice daily as well as Eliquis 5 mg twice daily in place of ASA given underlying A-fib.  Continue Toprol XL 25 mg once daily, atorvastatin 80 mg once daily.    Syncope -In the setting of ongoing cocaine abuse.  His syncopal episodes have consistently been associated with cocaine use, he denies any syncopal episodes without any drug use involved. Encouraged complete cessation of cocaine use. He politely deferred ZIO monitor today as he will attempt to quit.   HFrEF secondary to ICM -Euvolemic  and well compensated with NYHA class II symptoms. Echocardiogram 05/21/2022 with LVEF 35-40%, global hypokinesis, grade 1 DD, RV SF normal, mild MR. Cannot escalate GDMT at this time given his medication noncompliance and ongoing cocaine abuse.  We had a long discussion regarding his cocaine use and how continued drug use will worsen his heart failure and symptoms.  Can plan for repeat echocardiogram after he has been compliant on maximally tolerated medications for several months.  Continue GDMT Jardiance 10 mg once daily, Toprol-XL 25 mg once daily, Entresto 24/26 mg twice daily.  Plan to repeat BMP today.   Paroxysmal atrial fibrillation -Clinically maintaining sinus rhythm. CHA2DS2-VASc Score = 3 [CHF History: 1, HTN History: 0, Diabetes History: 0, Stroke History: 0, Vascular Disease History: 1, Age Score: 1, Gender Score: 0].  Therefore, the patient's annual risk of stroke is 3.2 %.  Recommended that he continue with Eliquis 5 mg twice daily, he does not meet dose reduction qualifications at this time.  Much education given on risk of stroke by not taking his OAC and ongoing cocaine use increases his chance for recurring arrhythmia.  Will repeat CBC today given most recent hemoglobin was 9.9.  Hyperlipidemia, goal LDL <55  -LDL 41 and TG 64 on 04/2022.  He is currently under goal.  Recommended that he continue atorvastatin 80 mg once daily.  Encouraged heart healthy dieting and increasing fiber, fruits, vegetables in his diet.  Work on decreasing processed foods.  Cocaine use -Typically uses cocaine at least every other weekend and has been since the 1980s.  He desires to stop.  Much education given on the dangers of cocaine use and CVD with regards to arrhythmias, heart attack, stroke, and death.   Anemia -Check CBC as above. Denies bleeding concerns currently. Follow with PCP.                Dispo:  Return in about 2 months (around 04/20/2023).  Signed, Denyce Robert, NP

## 2023-02-18 LAB — CBC
Hematocrit: 35.4 % — ABNORMAL LOW (ref 37.5–51.0)
Hemoglobin: 10.9 g/dL — ABNORMAL LOW (ref 13.0–17.7)
MCH: 26.5 pg — ABNORMAL LOW (ref 26.6–33.0)
MCHC: 30.8 g/dL — ABNORMAL LOW (ref 31.5–35.7)
MCV: 86 fL (ref 79–97)
Platelets: 381 10*3/uL (ref 150–450)
RBC: 4.11 x10E6/uL — ABNORMAL LOW (ref 4.14–5.80)
RDW: 14.1 % (ref 11.6–15.4)
WBC: 3.6 10*3/uL (ref 3.4–10.8)

## 2023-02-18 LAB — COMPREHENSIVE METABOLIC PANEL
ALT: 6 [IU]/L (ref 0–44)
AST: 10 [IU]/L (ref 0–40)
Albumin: 3.9 g/dL (ref 3.9–4.9)
Alkaline Phosphatase: 62 [IU]/L (ref 44–121)
BUN/Creatinine Ratio: 10 (ref 10–24)
BUN: 9 mg/dL (ref 8–27)
Bilirubin Total: <0.2 mg/dL (ref 0.0–1.2)
CO2: 24 mmol/L (ref 20–29)
Calcium: 8.9 mg/dL (ref 8.6–10.2)
Chloride: 106 mmol/L (ref 96–106)
Creatinine, Ser: 0.93 mg/dL (ref 0.76–1.27)
Globulin, Total: 2.6 g/dL (ref 1.5–4.5)
Glucose: 96 mg/dL (ref 70–99)
Potassium: 4.7 mmol/L (ref 3.5–5.2)
Sodium: 142 mmol/L (ref 134–144)
Total Protein: 6.5 g/dL (ref 6.0–8.5)
eGFR: 89 mL/min/{1.73_m2} (ref >59)

## 2023-04-03 ENCOUNTER — Ambulatory Visit: Payer: Medicare HMO | Admitting: Physician Assistant

## 2023-04-06 ENCOUNTER — Encounter: Payer: Medicare HMO | Admitting: Internal Medicine

## 2023-04-13 ENCOUNTER — Encounter: Payer: Medicare HMO | Admitting: Internal Medicine

## 2023-04-14 ENCOUNTER — Encounter: Payer: Self-pay | Admitting: Internal Medicine

## 2023-04-24 ENCOUNTER — Ambulatory Visit: Payer: Medicare HMO | Attending: Physician Assistant | Admitting: Physician Assistant
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
## 2023-06-10 DIAGNOSIS — Z113 Encounter for screening for infections with a predominantly sexual mode of transmission: Secondary | ICD-10-CM | POA: Diagnosis not present

## 2023-06-10 DIAGNOSIS — Z7252 High risk homosexual behavior: Secondary | ICD-10-CM | POA: Diagnosis not present

## 2023-06-10 DIAGNOSIS — Z202 Contact with and (suspected) exposure to infections with a predominantly sexual mode of transmission: Secondary | ICD-10-CM | POA: Diagnosis not present

## 2023-06-11 ENCOUNTER — Other Ambulatory Visit: Payer: Self-pay | Admitting: Internal Medicine

## 2023-07-07 ENCOUNTER — Ambulatory Visit (INDEPENDENT_AMBULATORY_CARE_PROVIDER_SITE_OTHER)

## 2023-07-07 VITALS — BP 112/70 | Ht 67.0 in | Wt 156.0 lb

## 2023-07-07 DIAGNOSIS — Z5320 Procedure and treatment not carried out because of patient's decision for unspecified reasons: Secondary | ICD-10-CM

## 2023-07-07 DIAGNOSIS — Z0000 Encounter for general adult medical examination without abnormal findings: Secondary | ICD-10-CM
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
## 2023-07-07 NOTE — Patient Instructions (Signed)
 Daryl Cook , Thank you for taking time to come for your Medicare Wellness Visit. I appreciate your ongoing commitment to your health goals. Please review the following plan we discussed and let me know if I can assist you in the future.   Referrals/Orders/Follow-Ups/Clinician Recommendations: follow up as scheduled for next Annual wellness visit.  This is a list of the screening recommended for you and due dates:  Health Maintenance  Topic Date Due   Colon Cancer Screening  08/13/2022   Flu Shot  10/02/2023   DTaP/Tdap/Td vaccine (3 - Td or Tdap) 11/24/2023   Medicare Annual Wellness Visit  07/06/2024   Pneumonia Vaccine  Completed   Hepatitis C Screening  Completed   Zoster (Shingles) Vaccine  Completed   HPV Vaccine  Aged Out   Meningitis B Vaccine  Aged Out   COVID-19 Vaccine  Discontinued    Advanced directives: (Declined) Advance directive discussed with you today. Even though you declined this today, please call our office should you change your mind, and we can give you the proper paperwork for you to fill out.  Next Medicare Annual Wellness Visit scheduled for next year: Yes  Have you seen your provider in the last 6 months (3 months if uncontrolled diabetes)? Yes

## 2023-07-08 ENCOUNTER — Encounter: Admitting: Internal Medicine

## 2023-09-08 ENCOUNTER — Other Ambulatory Visit: Payer: Self-pay | Admitting: Internal Medicine

## 2023-12-14 ENCOUNTER — Other Ambulatory Visit: Payer: Self-pay

## 2023-12-14 MED ORDER — GABAPENTIN 600 MG PO TABS: ORAL_TABLET | ORAL | 0 refills | Status: DC

## 2023-12-15 ENCOUNTER — Encounter: Payer: Self-pay | Admitting: Pharmacist

## 2023-12-15 NOTE — Progress Notes (Signed)
 Pharmacy Quality Measure Review  Statin Therapy for Patients with Cardiovascular Disease [facility])  Statin on current medication list though last refill at the pharmacy was 2024  Upcoming PCP/TOC visit scheduled.   Future message sent  Lipid panel overdue.  Consider discussion of resuming statin for secondary prevention, or trial different statin such as rosuvastatin   ICD-10-CM code exceptions:  Muscular pain:  Myopathy ICD-10-CM codes include G72.0, G72.2, and G72.9.  Myositis ICD-10-CM codes include M60.80, M60.811, M60.812, and more.   Myalgia ICD-10-CM codes include M79.10, M79.11, M79.12, and more.  Rhabdomyolysis ICD-10-CM code is M62.82.   Future Appointments  Date Time Provider Department Center  12/29/2023  2:00 PM Daryl Shivers, NP [facility] [address]

## 2023-12-29 ENCOUNTER — Encounter: Payer: Self-pay | Admitting: General Practice

## 2023-12-29 ENCOUNTER — Ambulatory Visit (INDEPENDENT_AMBULATORY_CARE_PROVIDER_SITE_OTHER): Admitting: General Practice

## 2023-12-29 VITALS — BP 118/60 | HR 94 | Temp 97.9°F | Ht 67.0 in | Wt 150.0 lb

## 2023-12-29 DIAGNOSIS — F1410 Cocaine abuse, uncomplicated: Secondary | ICD-10-CM

## 2023-12-29 DIAGNOSIS — E785 Hyperlipidemia, unspecified: Secondary | ICD-10-CM | POA: Diagnosis not present

## 2023-12-29 DIAGNOSIS — K219 Gastro-esophageal reflux disease without esophagitis: Secondary | ICD-10-CM | POA: Diagnosis not present

## 2023-12-29 DIAGNOSIS — Z0000 Encounter for general adult medical examination without abnormal findings: Secondary | ICD-10-CM

## 2023-12-29 DIAGNOSIS — I5022 Chronic systolic (congestive) heart failure: Secondary | ICD-10-CM | POA: Diagnosis not present

## 2023-12-29 DIAGNOSIS — Z125 Encounter for screening for malignant neoplasm of prostate: Secondary | ICD-10-CM

## 2023-12-29 DIAGNOSIS — I482 Chronic atrial fibrillation, unspecified: Secondary | ICD-10-CM | POA: Diagnosis not present

## 2023-12-29 DIAGNOSIS — Z23 Encounter for immunization: Secondary | ICD-10-CM | POA: Diagnosis not present

## 2023-12-29 DIAGNOSIS — Z7689 Persons encountering health services in other specified circumstances: Secondary | ICD-10-CM | POA: Insufficient documentation

## 2023-12-29 DIAGNOSIS — Z1211 Encounter for screening for malignant neoplasm of colon: Secondary | ICD-10-CM

## 2023-12-29 DIAGNOSIS — Z7984 Long term (current) use of oral hypoglycemic drugs: Secondary | ICD-10-CM

## 2023-12-29 MED ORDER — OMEPRAZOLE 20 MG PO CPDR: 20.0000 mg | DELAYED_RELEASE_CAPSULE | ORAL | 0 refills | Status: DC

## 2023-12-29 NOTE — Assessment & Plan Note (Signed)
 Reports that he has been off of his Eliquis  5 mg BID.  Hasn't seen cardiology since December.  Reports that he wants to get back into taking care of himself.
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
## 2023-12-29 NOTE — Assessment & Plan Note (Signed)
 Lipid panel pending.  Resume Atorvastatin  80 mg once daily.  Schedule f/u with cardiology.

## 2023-12-29 NOTE — Assessment & Plan Note (Signed)
 Immunizations UTD. Colonoscopy due  PSA due and pending.  Discussed the importance of a healthy diet and regular exercise in order for weight loss, and to reduce the risk of further co-morbidity.  Exam stable. Labs pending.  Follow up in 1 year for repeat physical.

## 2023-12-29 NOTE — Assessment & Plan Note (Signed)
 Still using it 4-5 times a week.  Discussed cessation.

## 2023-12-29 NOTE — Patient Instructions (Addendum)
 Stop by the lab prior to leaving today. I will notify you of your results once received.   PLEASE CALL CARDIOLOGY and schedule follow up.  [phone]  Resume Eliquis  5 mg twice daily.  Resume Lipitor 80 mg once daily.  Resume Jardiance  10 mg once daily.  Continue Gabapentin  600 mg 2 tablets at bedtime.  Resume Metoprolol  25 mg once daily.  Resume Sacubitril -Valsartan  24-26 twice daily.  Resume Brilinta  90 mg twice daily   Start Prilosec 20 mg once daily before breakfast. Prescription is at the pharmacy.   Follow up in 2 weeks.   It was a pleasure meeting you!

## 2023-12-29 NOTE — Assessment & Plan Note (Signed)
 Uncontrolled.  Resume Omeprazole 20 mg once daily.  F/u in 2 weeks.

## 2023-12-29 NOTE — Assessment & Plan Note (Signed)
 EMR reviewed briefly.

## 2023-12-29 NOTE — Assessment & Plan Note (Addendum)
 Discussed medication ahderence at length with patient.   Resume Jardiance  10 mg once daily.  Hold off on resuming Entresto  given his BP on the lower end.  Patient is asked to monitor BP at home or work, several times per month and return with written values at next office visit.   He will call and schedule cardiology f/u.   F/u with me in two weeks.

## 2023-12-30 ENCOUNTER — Ambulatory Visit: Payer: Self-pay | Admitting: General Practice

## 2023-12-30 LAB — LIPID PANEL
Cholesterol: 179 mg/dL (ref 0–200)
HDL: 60.3 mg/dL (ref >39.00)
LDL Cholesterol: 108 mg/dL — ABNORMAL HIGH (ref 0–99)
NonHDL: 118.22
Total CHOL/HDL Ratio: 3
Triglycerides: 52 mg/dL (ref 0.0–149.0)
VLDL: 10.4 mg/dL (ref 0.0–40.0)

## 2023-12-30 LAB — COMPREHENSIVE METABOLIC PANEL WITH GFR
ALT: 8 U/L (ref 0–53)
AST: 13 U/L (ref 0–37)
Albumin: 4.3 g/dL (ref 3.5–5.2)
Alkaline Phosphatase: 47 U/L (ref 39–117)
BUN: 16 mg/dL (ref 6–23)
CO2: 32 meq/L (ref 19–32)
Calcium: 9.2 mg/dL (ref 8.4–10.5)
Chloride: 104 meq/L (ref 96–112)
Creatinine, Ser: 1.06 mg/dL (ref 0.40–1.50)
GFR: 71.84 mL/min (ref >60.00)
Glucose, Bld: 97 mg/dL (ref 70–99)
Potassium: 5.1 meq/L (ref 3.5–5.1)
Sodium: 141 meq/L (ref 135–145)
Total Bilirubin: 0.3 mg/dL (ref 0.2–1.2)
Total Protein: 7.2 g/dL (ref 6.0–8.3)

## 2023-12-30 LAB — CBC
HCT: 41.3 % (ref 39.0–52.0)
Hemoglobin: 13.4 g/dL (ref 13.0–17.0)
MCHC: 32.4 g/dL (ref 30.0–36.0)
MCV: 83.1 fl (ref 78.0–100.0)
Platelets: 254 K/uL (ref 150.0–400.0)
RBC: 4.97 Mil/uL (ref 4.22–5.81)
RDW: 17.7 % — ABNORMAL HIGH (ref 11.5–15.5)
WBC: 4.5 K/uL (ref 4.0–10.5)

## 2023-12-30 LAB — PSA, MEDICARE: PSA: 1.33 ng/mL (ref 0.10–4.00)

## 2023-12-30 LAB — HEMOGLOBIN A1C: Hgb A1c MFr Bld: 5.6 % (ref 4.6–6.5)

## 2023-12-30 LAB — TSH: TSH: 3.13 u[IU]/mL (ref 0.35–5.50)

## 2023-12-31 MED ORDER — TICAGRELOR 90 MG PO TABS: 90.0000 mg | ORAL_TABLET | Freq: Two times a day (BID) | ORAL | 0 refills | Status: DC

## 2023-12-31 MED ORDER — EMPAGLIFLOZIN 10 MG PO TABS: 10.0000 mg | ORAL_TABLET | Freq: Every day | ORAL | 0 refills | Status: DC

## 2023-12-31 MED ORDER — ATORVASTATIN CALCIUM 80 MG PO TABS: 80.0000 mg | ORAL_TABLET | Freq: Every day | ORAL | 0 refills | Status: DC

## 2023-12-31 NOTE — Telephone Encounter (Signed)
 You have not refilled this medications yet; okay to refill?

## 2024-01-08 ENCOUNTER — Other Ambulatory Visit: Payer: Self-pay | Admitting: General Practice

## 2024-01-08 DIAGNOSIS — K219 Gastro-esophageal reflux disease without esophagitis: Secondary | ICD-10-CM

## 2024-01-12 ENCOUNTER — Encounter: Payer: Self-pay | Admitting: General Practice

## 2024-01-12 ENCOUNTER — Ambulatory Visit (INDEPENDENT_AMBULATORY_CARE_PROVIDER_SITE_OTHER): Admitting: General Practice

## 2024-01-12 VITALS — BP 120/60 | HR 102 | Temp 98.2°F | Ht 67.0 in | Wt 155.0 lb

## 2024-01-12 DIAGNOSIS — I480 Paroxysmal atrial fibrillation: Secondary | ICD-10-CM | POA: Diagnosis not present

## 2024-01-12 DIAGNOSIS — F320 Major depressive disorder, single episode, mild: Secondary | ICD-10-CM | POA: Insufficient documentation

## 2024-01-12 DIAGNOSIS — K219 Gastro-esophageal reflux disease without esophagitis: Secondary | ICD-10-CM

## 2024-01-12 DIAGNOSIS — F1410 Cocaine abuse, uncomplicated: Secondary | ICD-10-CM

## 2024-01-12 DIAGNOSIS — I700 Atherosclerosis of aorta: Secondary | ICD-10-CM | POA: Diagnosis not present

## 2024-01-12 DIAGNOSIS — I5022 Chronic systolic (congestive) heart failure: Secondary | ICD-10-CM | POA: Diagnosis not present

## 2024-01-12 MED ORDER — NITROGLYCERIN 0.4 MG SL SUBL: 0.4000 mg | SUBLINGUAL_TABLET | SUBLINGUAL | 0 refills | Status: DC | PRN

## 2024-01-12 NOTE — Patient Instructions (Addendum)
 As discussed, please start Eliquis  5 mg twice daily, Atorvastatin  80 mg once daily, Jardiance  10 mg once daily.   Discuss entresto  and brilinta  with cardiology at your appointment.   You will either be contacted via phone regarding your referral to [facility] (PHARMACIST) [facility]YCH[facility], or you may receive a letter on your MyChart portal from our referral team with instructions for scheduling an appointment. Please let us  know if you have not been contacted by anyone within two weeks.   Follow up in 3 months.   It was a pleasure to see you today!
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
## 2024-01-15 ENCOUNTER — Telehealth: Payer: Self-pay

## 2024-01-15 NOTE — Progress Notes (Signed)
 Complex Care Management Note  Care Guide Note 01/15/2024 Name: KHARSON RASMUSSON MRN: [id number] DOB: Jan 31, 1956  KAYCEON OKI is a 68 y.o. year old male who sees Vincente Shivers, NP for primary care. I reached out to Ozell FORBES Nanny by phone today to offer complex care management services.  Mr. Brickell was given information about Complex Care Management services today including:   The Complex Care Management services include support from the care team which includes your Nurse Care Manager, Clinical Social Worker, or Pharmacist.  The Complex Care Management team is here to help remove barriers to the health concerns and goals most important to you. Complex Care Management services are voluntary, and the patient may decline or stop services at any time by request to their care team member.   Complex Care Management Consent Status: Patient agreed to services and verbal consent obtained.   Follow up plan:  Telephone appointment with complex care management team member scheduled for:  01/20/24 at 2:00 p.m.   Encounter Outcome:  Patient Scheduled  Dreama [facility]  [facility] Assistant Direct Dial: [phone]  Fax: [phone]

## 2024-01-20 ENCOUNTER — Other Ambulatory Visit

## 2024-01-20 NOTE — Progress Notes (Deleted)
 01/20/2024 Name: Daryl Cook MRN: [id number] DOB: December 02, 1955  Subjective  No chief complaint on file.   Reason for visit: Daryl Cook is a 68 y.o. year old male who presented for a telephone visit.   They were referred to the pharmacist by their PCP for assistance in managing heart failure/Afib in the setting of self-discontinuation of all medications.   Pertinent PMH: AF, STEMI, HFrRF (35-40% 05/19/22, improved from <20% s/p NSTEMI), Hx HCV, HLD, RLS, Hx cocaine use, depression single episode mild   Care Team: Primary Care Provider: Vincente Shivers, NP Cardiologist: Bernardino Bring; Next Scheduled Visit: 02/09/24    Date of Last Visit: 01/12/24 with PCP Summary of Recent Visits and Key Medication Changes 11/11: stopped all his medications but resumed some after experiencing symptoms. felt unwell after taking Brilinta  90 mg, which he stopped (symptoms such as increased breathing rate, feeling 'funky', and thirst). He also had gastrointestinal symptoms on the following Friday and Saturday, which he attributes to possibly catching something or a flu shot. Not taking atorvastatin .   Medication Adherence and Access: Prescription drug coverage: Payor: Payor: [employer] MEDICARE / Plan: [employer] MEDICARE HMO / Product Type: *No Product type* /  Current Patient Assistance: None*** Denies missed doses of oral medications. {Blank single:19197::needs,does not need} prescriptions for medications today.   History of Present Illness: STEMI 04/12/22 managed at [facility]. C/b presentation with AF RVR. S/p PCI DES 3.5 x 38mm Resolute Onyx (zotarolimus-eluting) stent with 0% stenosis post intervention. Started on Entresto , brilinta , metoprolol  succinate 25, eliquis  though left hospital AMA.  Reported Regimen (CAD, CHF, AF): ?  Atorvastatin  - resumed 12/31/2023 (prior fill 11/2022) Jardiance  10 mg - resumed 12/31/23 (prior fill 01/2023) Brilinta  90 mg - resumed 12/31/23 (prior 06/2023 x30ds)  5 mg  BID - Last filled 2024  succinate - not resumed (last fill 11/19/22)  24/26 BIF - not resumed (last fill 11/2022)   Since Last visit: ?  Patient reports implementing plan from last visit. Has resumed Brilinta , atorvastatin , Jardiance ***?  Denies adverse effects with resumption of medications.   Interval History He reports no active symptoms of heart failure. No dyspnea, orthopnea, paroxysmal nocturnal dyspnea, or LE edema. No palpitations, chest pain, dizziness, pre-syncope or syncope. He reports a good appetite without bloating, abd distension or early satiety. He reports laying flat at night without difficulty.   Blood pressure self-monitoring BP cuff at home: {Blank single:19197::***,yes,no}  Regularly checking BP at home: {Blank single:19197::***,yes,no}  BP medications taken today: {Blank single:19197::***,yes,no}    Recent home BP readings ({Blank single:19197::Per BP meter,Per patient provided BP log,Per patient memory,***}):    Date  SBP (mmHg)  DBP (mmHg)  HR (bmp) ***  ***   ***   *** ***  ***   ***   *** ***  ***   ***   *** ***  ***   ***   *** ***  ***   ***   *** ***  ***   ***   *** ***  ***   ***   *** ***  ***   ***   ***  Weight self-monitoring ***  Lifestyle Factors:  Diet/Sodium Intake: *** Exercise: *** Alcohol: *** Caffeine: *** Contributing Medications: *** (TZD, NSAID, non-DHP CCB (HFrEF) Cocaine use: ***   Cardiovascular Risk Reduction History of clinical ASCVD? yes History of heart failure? yes History of hyperlipidemia? yes History of diabetes? no Taking statin? {yes; high intensity (atorvastatin  80, resumed 12/2023) Taking aspirin ? {Blank single:19197::indicated (primary prevention),indicated (secondary prevention),not  indicated,unclear if indicated}; {Blank single:19197::***,Taking,Not taking,Intolerant to,Declines}   Taking SGLT-2i? {Blank single:19197::yes,no} History of hypertension?:  no Taking ACEi/ARB? no  Cardiovascular Risk Reduction Immunizations:? Flu: Up to date (Last: 12/29/2023); Pneumococcal: Up to Date; Shingrix : Up to date (Last: 03/15/2019, 12/21/2018); Covid: Due (Last:01/23/2020); TDap: Due (Last: 11/23/2013)  Past Surgical History Past Surgical History:  Procedure Laterality Date   APPENDECTOMY  2004   COLONOSCOPY     CORONARY STENT INTERVENTION  04/2022   RCA   FRACTURE SURGERY     GUM SURGERY     HARDWARE REMOVAL Left 10/17/2016   Procedure: REMOVE HARDWARE LEFT ANKLE;  Surgeon: Harden Jerona GAILS, MD;  Location: [facility] OR;  Service: Orthopedics;  Laterality: Left;   HARDWARE REMOVAL Right 05/17/2019   Procedure: HARDWARE REMOVAL TIBIA;  Surgeon: Celena Sharper, MD;  Location: [facility] OR;  Service: Orthopedics;  Laterality: Right;   HARVEST BONE GRAFT Right 05/17/2019   Procedure: REPAIR OF NONUNION WITH ILIAC CREST BONE GRAFT FIBULAR OSTEOTOMY;  Surgeon: Celena Sharper, MD;  Location: [facility] OR;  Service: Orthopedics;  Laterality: Right;   ILIAC CREST BONE GRAFT Right 05/17/2019   REPAIR OF NONUNION WITH ILIAC CREST BONE GRAFT FIBULAR OSTEOTOMY (Right Hip)   IM NAILING TIBIA  05/17/2019   ORIF FIBULA FRACTURE Left 08/09/2016   Procedure: OPEN REDUCTION INTERNAL FIXATION (ORIF) LEFT PILON AND FIBULA FRACTURE;  Surgeon: Harden Jerona GAILS, MD;  Location: [facility] OR;  Service: Orthopedics;  Laterality: Left;   ORIF TIBIA FRACTURE Left 10/17/2016   Procedure: REVISION OPEN REDUCTION INTERNAL FIXATION (ORIF) DISTAL TIBIA FRACTURE VALGUS OSTEOTOMY;  Surgeon: Harden Jerona GAILS, MD;  Location: [facility] OR;  Service: Orthopedics;  Laterality: Left;   OSTECTOMY Right 01/20/2019   Procedure: OSTECTOMY RIGHT TIBIAL;  Surgeon: Celena Sharper, MD;  Location: [facility] OR;  Service: Orthopedics;  Laterality: Right;   remove hardware left ankle Left 10/17/2016   Right knee meniscus repair  05/2009   ROTATOR CUFF REPAIR Right 03/2012   Dr Rubie   TIBIA HARDWARE REMOVAL Right 05/17/2019   HARDWARE  REMOVAL TIBIA (Right Leg Lower)    TIBIA IM NAIL INSERTION Right 01/20/2019   Procedure: INTRAMEDULLARY (IM) NAIL TIBIAL SHAFT;  Surgeon: Celena Sharper, MD;  Location: [facility] OR;  Service: Orthopedics;  Laterality: Right;   TIBIA IM NAIL INSERTION Right 05/17/2019   Procedure: INTRAMEDULLARY (IM) NAIL TIBIAL;  Surgeon: Celena Sharper, MD;  Location: [facility] OR;  Service: Orthopedics;  Laterality: Right;   WISDOM TOOTH EXTRACTION       Social & Family History He  reports that he quit smoking about 23 years ago. His smoking use included cigarettes. He started smoking about 48 years ago. He has a 25 pack-year smoking history. He has been exposed to tobacco smoke. He has never used smokeless tobacco. He reports that he does not currently use alcohol after a past usage of about 6.0 standard drinks of alcohol per week. He reports that he does not use drugs.  Family History  Problem Relation Age of Onset   Osteoporosis Mother    Heart disease Mother        CABG   Diabetes Neg Hx    Hypertension Neg Hx    Colon cancer Neg Hx    Esophageal cancer Neg Hx    Liver cancer Neg Hx    Pancreatic cancer Neg Hx    Rectal cancer Neg Hx    Stomach cancer Neg Hx     Cardiovascular History, Studies and Procedures: Cath / PCI: NSTEMI 2024 s/p  DES CV Surgery: N/A EP Procedures and Devices: N/A Non-Invasive Evaluation(s): N/A Echo: Last 2024 with LVEF 30-35% (improved from <20% at hospital presentation for STEMI) Cardiac CT/MRI/Nuclear Tests: N/A Cardiopulmonary Stress Tests: ***      _______________________________________________  Objective    Review of Systems:? *** Constitutional:? No fever, chills or unintentional weight loss  Cardiovascular:? No chest pain or pressure, shortness of breath, dyspnea on exertion, orthopnea or LE edema  Pulmonary:? No cough or shortness of breath  GI:? No nausea, vomiting, constipation, diarrhea, abdominal pain, dyspepsia, change in bowel habits  Endocrine:? No polyuria,  polyphagia or blurred vision  Psych:? No depression, anxiety, insomnia    Physical Examination:  Vitals:  There were no vitals filed for this visit. BP Readings from Last 3 Encounters:  01/12/24 120/60  12/29/23 118/60  07/07/23 112/70   Pulse Readings from Last 3 Encounters:  01/12/24 (!) 102  12/29/23 94  02/17/23 89   Wt Readings from Last 3 Encounters:  01/12/24 155 lb (70.3 kg)  12/29/23 150 lb (68 kg)  07/07/23 156 lb (70.8 kg)    BMI Readings from Last 3 Encounters:  01/12/24 24.28 kg/m  12/29/23 23.49 kg/m  07/07/23 24.43 kg/m     Labs:?  Lab Results  Component Value Date   CREATININE 1.06 12/29/2023   CREATININE 0.93 02/17/2023   CREATININE 1.06 05/12/2016   BUN 16 12/29/2023   BUN 9 02/17/2023   BUN 14 01/29/2023   BUN 8 11/19/2022   K 5.1 12/29/2023   K 4.7 02/17/2023   AST 13 12/29/2023   AST 10 02/17/2023   ALT 8 12/29/2023   ALT 6 02/17/2023   ALT 67 (H) 11/23/2013   TSH 3.13 12/29/2023   BILITOT 0.3 12/29/2023   BILITOT <0.2 02/17/2023   BILITOT 0.7 04/22/2022   BILITOT 0.3 12/29/2016   BILITOT 0.2 11/23/2013   INR 1.0 05/13/2019   INR 0.9 01/20/2019   WBC 4.5 12/29/2023   HGB 13.4 12/29/2023   HGB 10.9 (L) 02/17/2023   HCT 41.3 12/29/2023   HCT 35.4 (L) 02/17/2023   PLT 254.0 12/29/2023   PLT 381 02/17/2023   CHOL 179 12/29/2023   TRIG 52.0 12/29/2023   HDL 60.30 12/29/2023   NONHDL 118.22 12/29/2023      Chemistry, Iron studies  Lab Results  Component Value Date   NA 141 12/29/2023   K 5.1 12/29/2023   CL 104 12/29/2023   CO2 32 12/29/2023   BUN 16 12/29/2023   CREATININE 1.06 12/29/2023   CALCIUM  9.2 12/29/2023   PHOS 2.3 04/02/2022   Lab Results  Component Value Date   RBC 4.97 12/29/2023   HGB 13.4 12/29/2023   HCT 41.3 12/29/2023   PLT 254.0 12/29/2023   MCV 83.1 12/29/2023      Metabolic Risk Considerations  Lab Results  Component Value Date   HGBA1C 5.6 12/29/2023   GLUCOSE 97 12/29/2023   CREATININE  1.06 12/29/2023   CREATININE 0.93 02/17/2023   CREATININE 0.89 01/29/2023   GFRNONAA >60 01/29/2023   GFRNONAA >60 05/17/2019   GFRNONAA >60 05/13/2019   Lab Results  Component Value Date   CHOL 179 12/29/2023   HDL 60.30 12/29/2023   HDL 49.30 04/22/2022   HDL 55.50 12/23/2019   AST 13 12/29/2023   AST 10 02/17/2023   ALT 8 12/29/2023   ALT 6 02/17/2023   The ASCVD Risk score (Arnett DK, et al., 2019) failed to calculate for the following reasons:   Risk  score cannot be calculated because patient has a medical history suggesting prior/existing ASCVD    Assessment and Plan:   1. CHF (35-40% 2024, though untreated for the past 12 months): Re-establishing care/medication management. Potassium WNL though on upper end. BP lower end of normal. Would benefit from BB and ARNI though may not tolerate both with current BP. Metoprolol  resumption ideal in the setting of NSTEMI and less effect on BP. ***did he start Jardiance ?  Consider ARNI vs ACEi/ARB at cardiology depending on future labs/potassium/BP.   Last ECHO: LVEF 35-40% (05/2022), imp from <20% on TEE at time of STEMI presentation Weights: *** BP: *** Labs: Lytes WNL (K 5.1), CBC w MCV >80% Current Regimen: ***  Continue medications today without changes.  Monitor and log weights, blood pressure, and heart rate Reviewed importance of avoid NSAIDs, *** Future Consideration: Beta blocker RAASi, defer to cardiology.   BP Readings from Last 3 Encounters:  01/12/24 120/60  12/29/23 118/60  07/07/23 112/70    2. CAD s/p NSTEMI 2024: Off of statin therapy x~12 months, resumed 12/31/23. Lipid panel on *** with LDL of *** mg/dL which is *** goal of *** mg/dL.  Key risk factors include: HLD, former smoker 25+ PY, prior MI (2024, non-compliance with antiplatelet therapy) Current Regimen: atorvastatin  80 mg daily, Brilinta  90 mg (resumed 12/31/23***), asa***? Continue medications today without changes.    3. AF w RVR: On  presentation of MI hospitalization 2024. Non-compliant to anticoagulation since diagnosis. Currently not on anticoagulation.  Current Regimen: None DOAC ***. Eliquis /Xarleto cost?    3. Healthcare Maintenance:  Pneumococcal - Current status: Series complete, PCV 19 s/p age 53   Shingles - Current status: Series complete, Shingrix  x2 Influenza - Current status: Up to date, received fall 2025  Due to receive the following vaccines: Tdap and Covid Booster   Follow Up Follow up with clinical pharmacist via *** in *** weeks.  ?   Future Appointments  Date Time Provider Department Center  01/20/2024  2:00 PM [facility] PHARMACIST [facility] [address]  02/09/2024 11:20 AM Abigail Bernardino HERO, PA-C [facility] None  04/13/2024  3:00 PM Vincente Shivers, NP [facility] [address]  07/11/2024  3:40 PM [facility] ANNUAL WELLNESS VISIT 1 [facility] [address]     Manuelita FABIENE Kobs, PharmD Clinical Pharmacist [facility] [phone]

## 2024-01-25 ENCOUNTER — Telehealth: Payer: Self-pay

## 2024-01-25 NOTE — Progress Notes (Signed)
 Complex Care Management Note Care Guide Note  01/25/2024 Name: Daryl Cook MRN: [id number] DOB: 1955/05/31   Complex Care Management Outreach Attempts: An unsuccessful telephone outreach was attempted today to offer the patient information about available complex care management services.  Follow Up Plan:  Additional outreach attempts will be made to offer the patient complex care management information and services.   Encounter Outcome:  No Answer  Dreama [facility]  [facility], [facility] VBCI Assistant Direct Dial: [phone]  Fax: [phone]

## 2024-02-01 ENCOUNTER — Encounter: Payer: Self-pay | Admitting: Pharmacist

## 2024-02-01 NOTE — Progress Notes (Signed)
 Complex Care Management Note Care Guide Note  02/01/2024 Name: Daryl Cook MRN: [id number] DOB: 02-21-1956   Complex Care Management Outreach Attempts: A second unsuccessful outreach was attempted today to offer the patient with information about available complex care management services.  Follow Up Plan:  No further outreach attempts will be made at this time. We have been unable to contact the patient to offer or enroll patient in complex care management services.  Encounter Outcome:  No Answer  Dreama [facility]  [facility], [facility] VBCI Assistant Direct Dial: [phone]  Fax: [phone]

## 2024-02-01 NOTE — Progress Notes (Signed)
 Complex Care Management Care Guide Note  02/01/2024 Name: Daryl Cook MRN: [id number] DOB: [birth year]  Daryl Cook is a 68 y.o. year old male who is a primary care patient of Vincente Shivers, NP and is actively engaged with the care management team. I reached out to Daryl Cook by phone today to assist with re-scheduling  with the Pharmacist.  Follow up plan: Telephone appointment with complex care management team member scheduled for:  02/03/24 at 2:00 p.m.   Dreama [facility]wood [facility]  [facility], [facility] VBCI Assistant Direct Dial: [phone]  Fax: [phone]

## 2024-02-01 NOTE — Progress Notes (Signed)
 12/1: Attempted to contact patient for scheduled appointment for medication management. Left HIPAA compliant message for patient to return my call at their convenience.   11/19: Attempted to contact patient for scheduled appointment for medication management. Left HIPAA compliant message for patient to return my call at their convenience.

## 2024-02-02 NOTE — Progress Notes (Signed)
 01/20/2024 Name: Daryl Cook MRN: [id number] DOB: [birth year]  Subjective  No chief complaint on file.  Reason for visit: Daryl Cook is a 68 y.o. year old male who presented for a telephone visit. They were referred to the pharmacist by their PCP for assistance in managing heart failure/Afib in the setting of self-discontinuation of all medications.   Pertinent PMH: AF, STEMI, HFrRF (35-40% 05/19/22, improved from <20% s/p NSTEMI), Hx HCV, HLD, RLS, Hx cocaine use, depression single episode mild   Care Team: Primary Care Provider: Vincente Shivers, NP Cardiologist: Bernardino Bring; Next Scheduled Visit: 02/09/24    Date of Last Visit: 01/12/24 with PCP Summary of Recent Visits and Key Medication Changes PCP 11/11: stopped all his medications but resumed some after experiencing symptoms. felt unwell after taking Brilinta  90 mg, which he stopped (symptoms such as increased breathing rate, feeling 'funky', and thirst). He also had gastrointestinal symptoms on the following Friday and Saturday, which he attributes to possibly catching something or a flu shot. Not taking atorvastatin .   History of Present Illness: STEMI 04/12/22 managed at [facility]. C/b presentation with AF RVR. S/p PCI DES 3.5 x 38mm Resolute Onyx (zotarolimus-eluting) stent with 0% stenosis post intervention. Started on Entresto , brilinta , metoprolol  succinate 25, eliquis  though left hospital AMA.  Interval History He reports no active symptoms of heart failure. No dyspnea, orthopnea, paroxysmal nocturnal dyspnea, or LE edema. Denies palpitations, chest pain, dizziness, pre-syncope or syncope.   Since last visit, patient reports he has picked up 3 medications from the pharmacy though has not resumed them yet (no specific barriers/concerns, just notes he has to remember to get started). Generally, feels once-daily dosing is easier for him to remember. Is open to pill packs to help with compliance/remembering doses.     Reported Regimen (CAD, CHF, AF): ?  Not taking any medications as of 02/03/24 Atorvastatin  - refilled 12/31/2023 (prior fill 11/2022) Jardiance  10 mg - refilled 12/31/23 (prior fill 01/2023) Brilinta  90 mg - refilled 12/31/23 (prior 06/2023 x30ds)  5 mg BID - Last filled 2024  succinate - not resumed (last fill 11/19/22)  24/26 BIF - not resumed (last fill 11/2022)   Medication Adherence and Access:  Current Patient Assistance: None Prescription drug coverage: [employer] Gold Plus [id number] (HMO-POS)  2026: [email or web address] $250 Rx deductible (Tier 3-5) Tier 3 = $47 after deductible  Notes Jardiance  was ~$400. Other medications were affordable.   Lives in a household of 1 with income via SSI and pensions. Estimated = 483% FPL.  Patient notes his girlfriend is moving in with him in the near future (no income). Not legally married, though consider for future household size based on program requirements.     Cardiovascular Risk Reduction History of clinical ASCVD? yes History of heart failure? yes History of hyperlipidemia? yes History of diabetes? no Taking statin? Plans to resume (atorvastatin  80mg ) Taking aspirin ? Plans to resume Brilinta    Taking SGLT-2i? Plans to resume Jardiance  if affordable (picked up 30 ds 12/30/33) History of hypertension?: no Taking ACEi/ARB? No (prescribed Entresto )   Cardiovascular Risk Reduction Immunizations:? Flu: Up to date (Last: 12/29/2023); Pneumococcal: Up to Date; Shingrix : Up to date (Last: 03/15/2019, 12/21/2018); Covid: Due (Last:01/23/2020); TDap: Due (Last: 11/23/2013)  Past Surgical History Past Surgical History:  Procedure Laterality Date   APPENDECTOMY  2004   COLONOSCOPY     CORONARY STENT INTERVENTION  04/2022   RCA   FRACTURE SURGERY     GUM SURGERY     HARDWARE  REMOVAL Left 10/17/2016   Procedure: REMOVE HARDWARE LEFT ANKLE;  Surgeon: Harden Jerona GAILS, MD;  Location: [facility]  OR;  Service: Orthopedics;  Laterality: Left;   HARDWARE REMOVAL Right 05/17/2019   Procedure: HARDWARE REMOVAL TIBIA;  Surgeon: Celena Sharper, MD;  Location: [facility] OR;  Service: Orthopedics;  Laterality: Right;   HARVEST BONE GRAFT Right 05/17/2019   Procedure: REPAIR OF NONUNION WITH ILIAC CREST BONE GRAFT FIBULAR OSTEOTOMY;  Surgeon: Celena Sharper, MD;  Location: [facility] OR;  Service: Orthopedics;  Laterality: Right;   ILIAC CREST BONE GRAFT Right 05/17/2019   REPAIR OF NONUNION WITH ILIAC CREST BONE GRAFT FIBULAR OSTEOTOMY (Right Hip)   IM NAILING TIBIA  05/17/2019   ORIF FIBULA FRACTURE Left 08/09/2016   Procedure: OPEN REDUCTION INTERNAL FIXATION (ORIF) LEFT PILON AND FIBULA FRACTURE;  Surgeon: Harden Jerona GAILS, MD;  Location: [facility] OR;  Service: Orthopedics;  Laterality: Left;   ORIF TIBIA FRACTURE Left 10/17/2016   Procedure: REVISION OPEN REDUCTION INTERNAL FIXATION (ORIF) DISTAL TIBIA FRACTURE VALGUS OSTEOTOMY;  Surgeon: Harden Jerona GAILS, MD;  Location: [facility] OR;  Service: Orthopedics;  Laterality: Left;   OSTECTOMY Right 01/20/2019   Procedure: OSTECTOMY RIGHT TIBIAL;  Surgeon: Celena Sharper, MD;  Location: [facility] OR;  Service: Orthopedics;  Laterality: Right;   remove hardware left ankle Left 10/17/2016   Right knee meniscus repair  05/2009   ROTATOR CUFF REPAIR Right 03/2012   Dr Rubie   TIBIA HARDWARE REMOVAL Right 05/17/2019   HARDWARE REMOVAL TIBIA (Right Leg Lower)    TIBIA IM NAIL INSERTION Right 01/20/2019   Procedure: INTRAMEDULLARY (IM) NAIL TIBIAL SHAFT;  Surgeon: Celena Sharper, MD;  Location: [facility] OR;  Service: Orthopedics;  Laterality: Right;   TIBIA IM NAIL INSERTION Right 05/17/2019   Procedure: INTRAMEDULLARY (IM) NAIL TIBIAL;  Surgeon: Celena Sharper, MD;  Location: [facility] OR;  Service: Orthopedics;  Laterality: Right;   WISDOM TOOTH EXTRACTION       Social & Family History He  reports that he quit smoking about 23 years ago. His smoking use included cigarettes. He started smoking  about 48 years ago. He has a 25 pack-year smoking history. He has been exposed to tobacco smoke. He has never used smokeless tobacco. He reports that he does not currently use alcohol after a past usage of about 6.0 standard drinks of alcohol per week. He reports that he does not use drugs.  Family History  Problem Relation Age of Onset   Osteoporosis Mother    Heart disease Mother        CABG   Diabetes Neg Hx    Hypertension Neg Hx    Colon cancer Neg Hx    Esophageal cancer Neg Hx    Liver cancer Neg Hx    Pancreatic cancer Neg Hx    Rectal cancer Neg Hx    Stomach cancer Neg Hx     Cardiovascular History, Studies and Procedures: Cath / PCI: NSTEMI 2024 s/p DES CV Surgery: N/A EP Procedures and Devices: N/A Non-Invasive Evaluation(s): N/A Echo: Last 2024 with LVEF 30-35% (improved from <20% at hospital presentation for STEMI) Cardiac CT/MRI/Nuclear Tests: N/A      _______________________________________________  Objective    Review of Systems:?  Constitutional:? No fever, chills or unintentional weight loss  Cardiovascular:? No chest pain or pressure, shortness of breath, dyspnea on exertion, orthopnea or LE edema  Pulmonary:? No cough or shortness of breath    Vitals:  There were no vitals filed for this visit. BP Readings from Last  3 Encounters:  01/12/24 120/60  12/29/23 118/60  07/07/23 112/70   Pulse Readings from Last 3 Encounters:  01/12/24 (!) 102  12/29/23 94  02/17/23 89   Wt Readings from Last 3 Encounters:  01/12/24 155 lb (70.3 kg)  12/29/23 150 lb (68 kg)  07/07/23 156 lb (70.8 kg)    BMI Readings from Last 3 Encounters:  01/12/24 24.28 kg/m  12/29/23 23.49 kg/m  07/07/23 24.43 kg/m    Labs:?  Lab Results  Component Value Date   CREATININE 1.06 12/29/2023   CREATININE 0.93 02/17/2023   CREATININE 1.06 05/12/2016   BUN 16 12/29/2023   BUN 9 02/17/2023   BUN 14 01/29/2023   BUN 8 11/19/2022   K 5.1 12/29/2023   K 4.7 02/17/2023   AST  13 12/29/2023   AST 10 02/17/2023   ALT 8 12/29/2023   ALT 6 02/17/2023   ALT 67 (H) 11/23/2013   TSH 3.13 12/29/2023   BILITOT 0.3 12/29/2023   BILITOT <0.2 02/17/2023   BILITOT 0.7 04/22/2022   BILITOT 0.3 12/29/2016   BILITOT 0.2 11/23/2013   INR 1.0 05/13/2019   INR 0.9 01/20/2019   WBC 4.5 12/29/2023   HGB 13.4 12/29/2023   HGB 10.9 (L) 02/17/2023   HCT 41.3 12/29/2023   HCT 35.4 (L) 02/17/2023   PLT 254.0 12/29/2023   PLT 381 02/17/2023   CHOL 179 12/29/2023   TRIG 52.0 12/29/2023   HDL 60.30 12/29/2023   NONHDL 118.22 12/29/2023      Chemistry, Iron studies  Lab Results  Component Value Date   NA 141 12/29/2023   K 5.1 12/29/2023   CL 104 12/29/2023   CO2 32 12/29/2023   BUN 16 12/29/2023   CREATININE 1.06 12/29/2023   CALCIUM  9.2 12/29/2023   PHOS 2.3 04/02/2022   Lab Results  Component Value Date   RBC 4.97 12/29/2023   HGB 13.4 12/29/2023   HCT 41.3 12/29/2023   PLT 254.0 12/29/2023   MCV 83.1 12/29/2023      Metabolic Risk Considerations  Lab Results  Component Value Date   HGBA1C 5.6 12/29/2023   GLUCOSE 97 12/29/2023   CREATININE 1.06 12/29/2023   CREATININE 0.93 02/17/2023   CREATININE 0.89 01/29/2023   GFRNONAA >60 01/29/2023   GFRNONAA >60 05/17/2019   GFRNONAA >60 05/13/2019   Lab Results  Component Value Date   CHOL 179 12/29/2023   HDL 60.30 12/29/2023   HDL 49.30 04/22/2022   HDL 55.50 12/23/2019   AST 13 12/29/2023   AST 10 02/17/2023   ALT 8 12/29/2023   ALT 6 02/17/2023   The ASCVD Risk score (Arnett DK, et al., 2019) failed to calculate for the following reasons:   Risk score cannot be calculated because patient has a medical history suggesting prior/existing ASCVD    Assessment and Plan:   1. CHF (35-40% 2024, though untreated for the past 12 months): Re-establishing care/medication management. Potassium WNL though on upper end. BP lower end of normal. Would benefit from BB and ARNI though may not tolerate both with  current BP. Cardiology follow up next week. Focus for today = compliance and medication cost. Patient likes idea of pill packs through [facility] for improved compliance.  Last ECHO: LVEF 35-40% (05/2022), imp from <20% on TEE at time of STEMI presentation After Cardiology visit 12/9, plan for pill packs through [facility].  [employer]: May be eligible for cardiomyopathy fund. Dependent of disease verification.  Future Consideration: Beta blocker, RAASi if  tolerated w BP - defer to cardiology.   2. Medication Access Medication Assistance - <500% FPL, should be eligible for Medicare Grants [employer] - Re-enrollment 2025 Medication(s): All cardiomyopathy and related medications (Entresto , Eliquis , Jardiance ) Application Status:  Pending Diagnosis verification (12/3: faxed to [facility], Bernardino Bring) [facility] Medicare 2026 - Reviewed plan details for 2026 with patient: [employer] Gold Plus [id number] (HMO-POS)    3. CAD s/p NSTEMI 2024: LDL uncontrolled, 108 mg/dL (89/71/74) though off of statin therapy x~12 months. Not taking antiplatelet though willing to resume.  LDL goal <55 mg/dL in the setting of secondary prevention.  Key risk factors include: HLD, former smoker 25+ PY, prior MI (2024, non-compliance with antiplatelet therapy) Current Regimen: NOT TAKING: atorvastatin  80 mg, Brilinta  90 mg   4. AF w RVR: Non-compliant to anticoagulation since diagnosis. Currently not on anticoagulation. May be covered under [employer] grant if approved. Prefers once-daily, consider Xarelto  for compliance depending on perceived GIB risk. Defer to cardiology.  Current Regimen: None   5. Healthcare Maintenance:  Pneumococcal - Current status: Series complete, PCV 19 s/p age 15   Shingles - Current status: Series complete, Shingrix  x2 Influenza - Current status: Up to date, received fall 2025  Due to receive the following vaccines: Tdap and Covid  Booster   Follow Up Follow up with clinical pharmacist via phone ~1 week to assist with pill packs through [facility] pending cardiology medication plan  Future Appointments  Date Time Provider Department Center  02/09/2024 11:20 AM Bring Bernardino HERO, PA-C [facility] None  04/13/2024  3:00 PM Vincente Shivers, NP [facility] [address]  07/11/2024  3:40 PM [facility] ANNUAL WELLNESS VISIT 1 [facility] [address]   Manuelita FABIENE Kobs, PharmD Clinical Pharmacist [facility] [phone]

## 2024-02-03 ENCOUNTER — Other Ambulatory Visit: Admitting: Pharmacist

## 2024-02-03 DIAGNOSIS — I5022 Chronic systolic (congestive) heart failure: Secondary | ICD-10-CM

## 2024-02-03 DIAGNOSIS — I2119 ST elevation (STEMI) myocardial infarction involving other coronary artery of inferior wall: Secondary | ICD-10-CM

## 2024-02-03 DIAGNOSIS — I480 Paroxysmal atrial fibrillation: Secondary | ICD-10-CM

## 2024-02-03 NOTE — Patient Instructions (Addendum)
 Mr. Daryl Cook,   It was a pleasure to speak with you today! As we discussed:?   INSURANCE 2026: **Do call [employer] just to confirm you would like to stay with the same plan that you had this year. I did check the benefits and it looks like a decent plan with good drug benefits.   Plan name 2026 = [employer] Gold Plus [id number] (HMO-POS)    MEDICATION COST: In 2026, with the plan above, brand medicines like Jardiance  would be $47/month after your deductible of $250  Separately, I have started the enrollment process to see if we can get you extra coverage through a Grant for Medicare patients that would pay for the cost of your heart-related medications. If we can get this approved, the Jardiance  and other meds would ideally be $0    MEDICATION PILL PACKS: (I will help with getting this set up after your Cardiology visit on 12/9) [facility] has a handful of different pharmacies that you can use in [place], [address] and [place].  You may also choose to do Mail Order/Delivery through [facility] which is a free service.   Mail Order/Delivery: [facility] offers home delivery services through all [facility], with no cost for shipping prescription medications.  Home delivery prescriptions often arrive next day. If your delivery address is outside of the [facility] service area, your prescriptions will be delivered by [employer] and should arrive within two days.  You may contact [phone]) [phone] at any time with questions or requests to change your prescriptions to home delivery status.  Before your medications are mailed out to you for the very first time, you must call the number above to confirm your address and payment information.   [facility] at [facility] [address], [place], [place] [address] Ph: [phone] Hours: 7:30 AM - 7 PM Mon-Fri; Sat 8 am - 4:30 pm; Sunday 10 am - 2 pm (starting 9/7)    Please reach out prior to your next  scheduled appointment should you have any questions or concerns.  You may reach me via MyChart Message, or you may leave me a voicemail at [phone] and I will get back to you shortly.   Thank you!   Future Appointments  Date Time Provider Department Center  02/09/2024 11:20 AM Abigail Bernardino HERO, PA-C [facility] None  04/13/2024  3:00 PM Vincente Shivers, NP [facility] [address]  07/11/2024  3:40 PM [facility] ANNUAL WELLNESS VISIT 1 [facility] [address]    Manuelita FABIENE Kobs, PharmD Clinical Pharmacist [address]    [place], [place]  [phone]
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
## 2024-02-09 ENCOUNTER — Ambulatory Visit: Attending: Physician Assistant | Admitting: Physician Assistant

## 2024-02-09 ENCOUNTER — Encounter: Payer: Self-pay | Admitting: Physician Assistant

## 2024-02-09 VITALS — BP 140/70 | HR 79 | Ht 67.0 in | Wt 156.2 lb

## 2024-02-09 DIAGNOSIS — I2510 Atherosclerotic heart disease of native coronary artery without angina pectoris: Secondary | ICD-10-CM

## 2024-02-09 DIAGNOSIS — E785 Hyperlipidemia, unspecified: Secondary | ICD-10-CM

## 2024-02-09 DIAGNOSIS — I480 Paroxysmal atrial fibrillation: Secondary | ICD-10-CM

## 2024-02-09 DIAGNOSIS — I255 Ischemic cardiomyopathy: Secondary | ICD-10-CM

## 2024-02-09 DIAGNOSIS — I5020 Unspecified systolic (congestive) heart failure: Secondary | ICD-10-CM

## 2024-02-09 DIAGNOSIS — F1410 Cocaine abuse, uncomplicated: Secondary | ICD-10-CM

## 2024-02-09 MED ORDER — ATORVASTATIN CALCIUM 80 MG PO TABS: 80.0000 mg | ORAL_TABLET | Freq: Every day | ORAL | 3 refills | Status: AC

## 2024-02-09 MED ORDER — RIVAROXABAN 20 MG PO TABS: 20.0000 mg | ORAL_TABLET | Freq: Every day | ORAL | 3 refills | Status: AC

## 2024-02-09 NOTE — Patient Instructions (Signed)
 Medication Instructions:  Your physician recommends the following medication changes.  START TAKING: Xarelto  20 mg daily Lipitor 80 mg daily  *If you need a refill on your cardiac medications before your next appointment, please call your pharmacy*  Lab Work: None ordered at this time  If you have labs (blood work) drawn today and your tests are completely normal, you will receive your results only by: MyChart Message (if you have MyChart) OR A paper copy in the mail If you have any lab test that is abnormal or we need to change your treatment, we will call you to review the results.  Testing/Procedures: Your physician has requested that you have an echocardiogram. Echocardiography is a painless test that uses sound waves to create images of your heart. It provides your doctor with information about the size and shape of your heart and how well your heart's chambers and valves are working.   You may receive an ultrasound enhancing agent through an IV if needed to better visualize your heart during the echo. This procedure takes approximately one hour.  There are no restrictions for this procedure.  This will take place at [address] [facility] [address]) [address], [place] [address]  Please note: We ask at that you not bring children with you during ultrasound (echo/ vascular) testing. Due to room size and safety concerns, children are not allowed in the ultrasound rooms during exams. Our front office staff cannot provide observation of children in our lobby area while testing is being conducted. An adult accompanying a patient to their appointment will only be allowed in the ultrasound room at the discretion of the ultrasound technician under special circumstances. We apologize for any inconvenience.   Follow-Up: At [facility], you and your health needs are our priority.  As part of our continuing mission to provide you with exceptional heart care, our providers are all  part of one team.  This team includes your primary Cardiologist (physician) and Advanced Practice Providers or APPs (Physician Assistants and Nurse Practitioners) who all work together to provide you with the care you need, when you need it.  Your next appointment:   After echo  Provider:   You may see Deatrice Cage, MD or Bernardino Bring, PA-C

## 2024-02-10 ENCOUNTER — Encounter: Payer: Self-pay | Admitting: Pharmacist

## 2024-02-10 NOTE — Progress Notes (Signed)
 Chart Review Reason: Medication Access  Summary: Cardiology visit 12/9 with plan to resume only the following medications: Xarelto  20 mg/d, atorvastatin  80 mg/d  Submitted diagnosis clarification for to [facility] today, has been approved for grant which will cover the cost of heart failure medications, as well as DOAC.  Jardiance  removed from med list as patient reported not taking.  Msg to cardiology to confirm if this is a medication they prefer to defer or resume if $0 through [employer] grant.   Considerations: Called patient and left a engineer, technical sales, also sent more-detailed Mychart message regarding approval for [employer]. Should bring the cost of Xarelto  down to $0.  Also will clarify if only on 2 meds, pill packs may not be as useful.    Manuelita FABIENE Kobs, PharmD Clinical Pharmacist [facility] [phone]

## 2024-02-11 ENCOUNTER — Telehealth: Payer: Self-pay | Admitting: Pharmacist

## 2024-02-11 NOTE — Progress Notes (Signed)
 Thank you for the update.  He reported not taking any medications for several months when he was seen in the office on 12/9.  He could not tell me how many months he had been without medication.  We will reintroduce GDMT in a stepwise fashion and follow-up visits.  So for now, defer reinitiation of SGLT2 inhibitor with plans to at this moving forward.  Thanks for the update again.

## 2024-02-11 NOTE — Progress Notes (Signed)
 Brief Telephone Documentation Reason for Call: Patient returning call from pharmacist  Summary of Call: Reviewed with patient the [facility] program.  Patient plans to pick up Xarelto  and atorvastatin  from the pharmacy this afternoon.   Called [facility] and provided the pharmacy with [employer] information for re-billing of the Xarelto .   Follow Up: Patient given direct line for further questions/concerns.  Manuelita FABIENE Kobs, PharmD Clinical Pharmacist [facility] [phone]

## 2024-03-11 ENCOUNTER — Ambulatory Visit: Attending: Physician Assistant

## 2024-03-11 DIAGNOSIS — I255 Ischemic cardiomyopathy: Secondary | ICD-10-CM

## 2024-03-11 LAB — ECHOCARDIOGRAM COMPLETE
AR max vel: 3.07 cm2
AV Area VTI: 2.99 cm2
AV Area mean vel: 2.79 cm2
AV Mean grad: 1.3 mmHg
AV Peak grad: 2.6 mmHg
Ao pk vel: 0.8 m/s
Area-P 1/2: 4.86 cm2
Calc EF: 40.7 %
S' Lateral: 3.9 cm
Single Plane A2C EF: 40.2 %
Single Plane A4C EF: 38.8 %

## 2024-03-12 ENCOUNTER — Ambulatory Visit: Payer: Self-pay | Admitting: Physician Assistant

## 2024-03-12 DIAGNOSIS — Z79899 Other long term (current) drug therapy: Secondary | ICD-10-CM

## 2024-03-14 MED ORDER — SACUBITRIL-VALSARTAN 24-26 MG PO TABS: 1.0000 | ORAL_TABLET | Freq: Two times a day (BID) | ORAL | 11 refills | Status: DC

## 2024-03-14 MED ORDER — METOPROLOL SUCCINATE ER 25 MG PO TB24: 12.5000 mg | ORAL_TABLET | Freq: Every day | ORAL | 3 refills | Status: AC

## 2024-03-18 ENCOUNTER — Other Ambulatory Visit: Payer: Self-pay | Admitting: Emergency Medicine

## 2024-03-18 DIAGNOSIS — Z79899 Other long term (current) drug therapy: Secondary | ICD-10-CM

## 2024-03-20 ENCOUNTER — Other Ambulatory Visit: Payer: Self-pay | Admitting: Family
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
## 2024-03-22 ENCOUNTER — Ambulatory Visit: Attending: Physician Assistant | Admitting: Physician Assistant

## 2024-03-22 DIAGNOSIS — F1410 Cocaine abuse, uncomplicated: Secondary | ICD-10-CM

## 2024-03-22 DIAGNOSIS — I2510 Atherosclerotic heart disease of native coronary artery without angina pectoris: Secondary | ICD-10-CM

## 2024-03-22 DIAGNOSIS — I5020 Unspecified systolic (congestive) heart failure: Secondary | ICD-10-CM

## 2024-03-22 DIAGNOSIS — I255 Ischemic cardiomyopathy: Secondary | ICD-10-CM

## 2024-03-22 DIAGNOSIS — I480 Paroxysmal atrial fibrillation: Secondary | ICD-10-CM

## 2024-03-22 DIAGNOSIS — E785 Hyperlipidemia, unspecified: Secondary | ICD-10-CM

## 2024-03-24 ENCOUNTER — Encounter: Payer: Self-pay | Admitting: Physician Assistant
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
## 2024-04-01 ENCOUNTER — Encounter: Payer: Self-pay | Admitting: Physician Assistant

## 2024-04-01 ENCOUNTER — Ambulatory Visit: Attending: Physician Assistant | Admitting: Physician Assistant

## 2024-04-01 VITALS — BP 132/64 | HR 74 | Ht 67.0 in | Wt 154.6 lb

## 2024-04-01 DIAGNOSIS — I2510 Atherosclerotic heart disease of native coronary artery without angina pectoris: Secondary | ICD-10-CM

## 2024-04-01 DIAGNOSIS — Z79899 Other long term (current) drug therapy: Secondary | ICD-10-CM | POA: Diagnosis not present

## 2024-04-01 DIAGNOSIS — F1410 Cocaine abuse, uncomplicated: Secondary | ICD-10-CM | POA: Diagnosis not present

## 2024-04-01 DIAGNOSIS — I5020 Unspecified systolic (congestive) heart failure: Secondary | ICD-10-CM

## 2024-04-01 DIAGNOSIS — E785 Hyperlipidemia, unspecified: Secondary | ICD-10-CM

## 2024-04-01 DIAGNOSIS — I480 Paroxysmal atrial fibrillation: Secondary | ICD-10-CM

## 2024-04-01 DIAGNOSIS — I255 Ischemic cardiomyopathy: Secondary | ICD-10-CM | POA: Diagnosis not present

## 2024-04-01 MED ORDER — SACUBITRIL-VALSARTAN 49-51 MG PO TABS: 1.0000 | ORAL_TABLET | Freq: Two times a day (BID) | ORAL | 11 refills | Status: AC

## 2024-04-01 NOTE — Patient Instructions (Signed)
 Medication Instructions:  Your physician recommends the following medication changes.  STOP TAKING: Brillinta (I'll call [facility] to cancel this prescription fill  INCREASE: Entresto  49/51 mg twice daily   *If you need a refill on your cardiac medications before your next appointment, please call your pharmacy*  Lab Work: Your provider would like for you to have following labs drawn today CBC and BMeT.   If you have labs (blood work) drawn today and your tests are completely normal, you will receive your results only by: MyChart Message (if you have MyChart) OR A paper copy in the mail If you have any lab test that is abnormal or we need to change your treatment, we will call you to review the results.  Testing/Procedures: None ordered at this time   Follow-Up: At [facility], you and your health needs are our priority.  As part of our continuing mission to provide you with exceptional heart care, our providers are all part of one team.  This team includes your primary Cardiologist (physician) and Advanced Practice Providers or APPs (Physician Assistants and Nurse Practitioners) who all work together to provide you with the care you need, when you need it.  Your next appointment:   1 month(s)  Provider:   You may see Deatrice Cage, MD or Bernardino Bring, PA-C

## 2024-04-02 LAB — CBC
Hematocrit: 42.9 % (ref 37.5–51.0)
Hemoglobin: 13.7 g/dL (ref 13.0–17.7)
MCH: 28.4 pg (ref 26.6–33.0)
MCHC: 31.9 g/dL (ref 31.5–35.7)
MCV: 89 fL (ref 79–97)
Platelets: 270 10*3/uL (ref 150–450)
RBC: 4.83 x10E6/uL (ref 4.14–5.80)
RDW: 15.9 % — ABNORMAL HIGH (ref 11.6–15.4)
WBC: 4.2 10*3/uL (ref 3.4–10.8)

## 2024-04-02 LAB — BASIC METABOLIC PANEL WITH GFR
BUN/Creatinine Ratio: 14 (ref 10–24)
BUN: 13 mg/dL (ref 8–27)
CO2: 25 mmol/L (ref 20–29)
Calcium: 8.8 mg/dL (ref 8.6–10.2)
Chloride: 105 mmol/L (ref 96–106)
Creatinine, Ser: 0.95 mg/dL (ref 0.76–1.27)
Glucose: 101 mg/dL — ABNORMAL HIGH (ref 70–99)
Potassium: 4.3 mmol/L (ref 3.5–5.2)
Sodium: 145 mmol/L — ABNORMAL HIGH (ref 134–144)
eGFR: 87 mL/min/{1.73_m2}

## 2024-04-04 ENCOUNTER — Ambulatory Visit: Payer: Self-pay | Admitting: Physician Assistant

## 2024-04-13 ENCOUNTER — Ambulatory Visit: Admitting: General Practice

## 2024-05-03 ENCOUNTER — Ambulatory Visit: Admitting: Physician Assistant

## 2024-07-07 ENCOUNTER — Ambulatory Visit

## 2024-07-11 ENCOUNTER — Ambulatory Visit
